# Patient Record
Sex: Female | Born: 1980 | Race: Black or African American | Hispanic: No | Marital: Married | Smoking: Former smoker
Health system: Southern US, Community
[De-identification: ages and names within clinical notes are randomized; demographics above are authoritative.]

## PROBLEM LIST (undated history)

## (undated) DIAGNOSIS — Z789 Other specified health status: Secondary | ICD-10-CM

## (undated) HISTORY — PX: DILATION AND CURETTAGE OF UTERUS: SHX78

---

## 2005-07-29 ENCOUNTER — Ambulatory Visit (HOSPITAL_COMMUNITY): Admission: RE | Admit: 2005-07-29 | Discharge: 2005-07-29 | Payer: Self-pay | Admitting: Gynecology

## 2005-09-15 ENCOUNTER — Ambulatory Visit (HOSPITAL_COMMUNITY): Admission: RE | Admit: 2005-09-15 | Discharge: 2005-09-15 | Payer: Self-pay | Admitting: Gynecology

## 2005-12-21 ENCOUNTER — Ambulatory Visit: Payer: Self-pay | Admitting: Family Medicine

## 2005-12-22 ENCOUNTER — Ambulatory Visit (HOSPITAL_COMMUNITY): Admission: RE | Admit: 2005-12-22 | Discharge: 2005-12-22 | Payer: Self-pay | Admitting: Family Medicine

## 2005-12-28 ENCOUNTER — Ambulatory Visit: Payer: Self-pay | Admitting: Family Medicine

## 2006-01-11 ENCOUNTER — Ambulatory Visit: Payer: Self-pay | Admitting: Obstetrics & Gynecology

## 2006-01-18 ENCOUNTER — Ambulatory Visit: Payer: Self-pay | Admitting: Obstetrics & Gynecology

## 2006-01-21 ENCOUNTER — Ambulatory Visit: Payer: Self-pay | Admitting: Family Medicine

## 2006-01-25 ENCOUNTER — Ambulatory Visit: Payer: Self-pay | Admitting: Obstetrics & Gynecology

## 2006-01-25 ENCOUNTER — Ambulatory Visit (HOSPITAL_COMMUNITY): Admission: RE | Admit: 2006-01-25 | Discharge: 2006-01-25 | Payer: Self-pay | Admitting: Gynecology

## 2006-01-28 ENCOUNTER — Ambulatory Visit: Payer: Self-pay | Admitting: Family Medicine

## 2006-02-01 ENCOUNTER — Ambulatory Visit (HOSPITAL_COMMUNITY): Admission: RE | Admit: 2006-02-01 | Discharge: 2006-02-01 | Payer: Self-pay | Admitting: Family Medicine

## 2006-02-01 ENCOUNTER — Ambulatory Visit: Payer: Self-pay | Admitting: Family Medicine

## 2006-02-04 ENCOUNTER — Ambulatory Visit: Payer: Self-pay | Admitting: Family Medicine

## 2006-02-06 ENCOUNTER — Inpatient Hospital Stay (HOSPITAL_COMMUNITY): Admission: AD | Admit: 2006-02-06 | Discharge: 2006-02-08 | Payer: Self-pay | Admitting: Obstetrics & Gynecology

## 2006-02-06 ENCOUNTER — Ambulatory Visit: Payer: Self-pay | Admitting: Obstetrics and Gynecology

## 2007-11-03 IMAGING — US US OB COMP LESS 14 WK
1 series · 14 of 28 positions shown · non-contrast
Comparison: none

CLINICAL DATA: Uncertain menstrual dates.  Irregular periods.  Evaluate dating.  
 OBSTETRICAL ULTRASOUND <14 WKS AND TRANSVAGINAL OB US:
TECHNIQUE: Both transabdominal and transvaginal ultrasound examinations were performed for complete evaluation of the gestation as well as the maternal uterus, adnexal regions, and pelvic cul-de-sac.

[Series 1: us ob comp less 14 wk · 0.24mm/px · 14 of 30 slices shown]
[im 2/30]
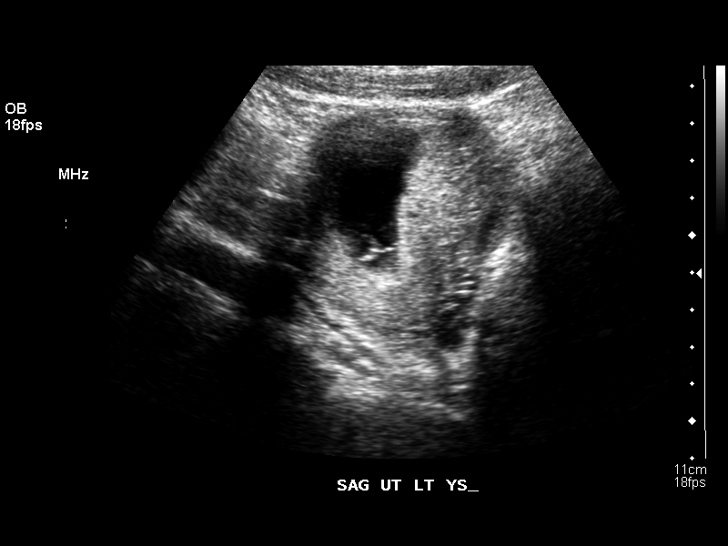
[im 4/30]
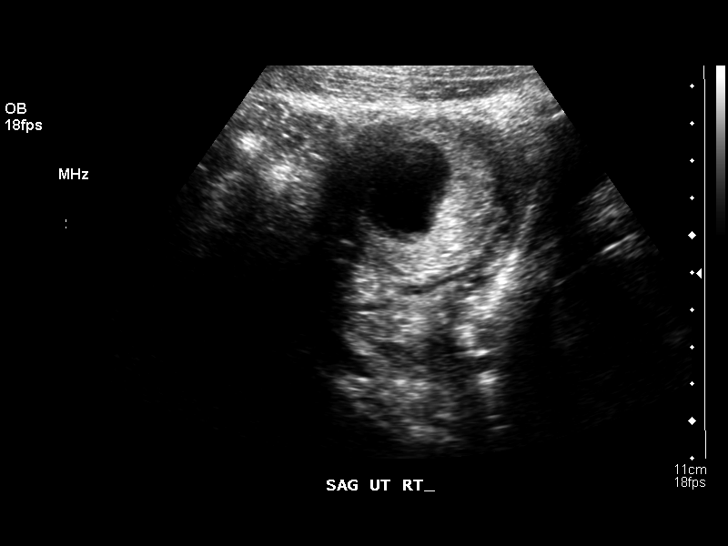
[im 6/30]
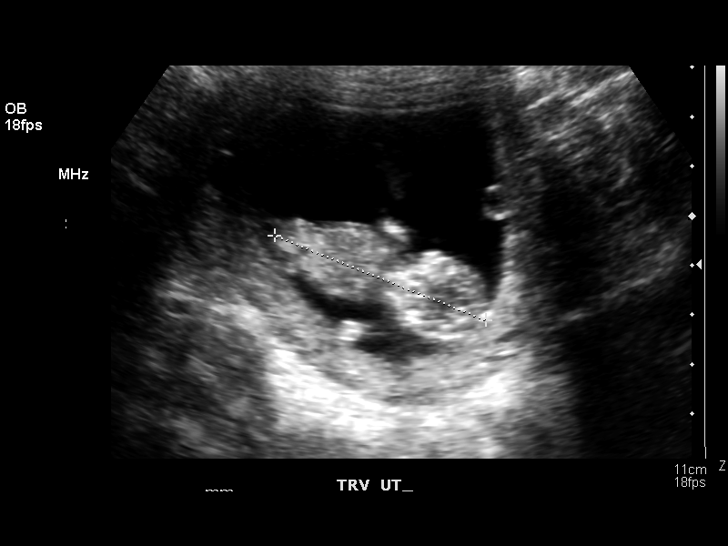
[im 8/30]
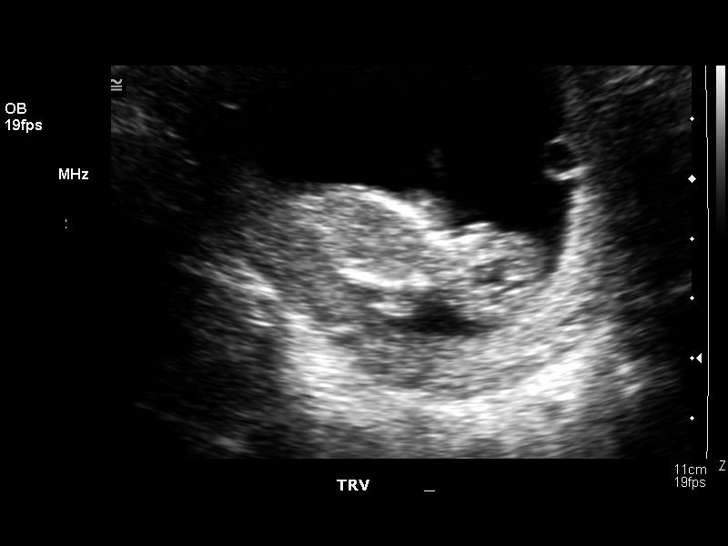
[im 10/30]
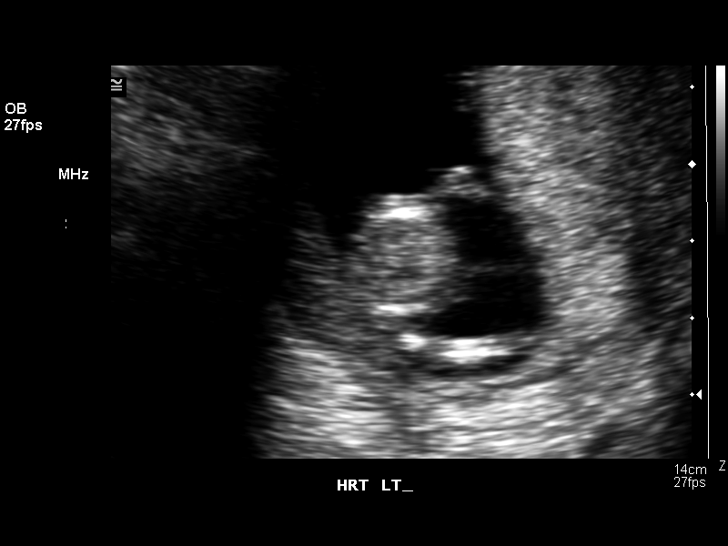
[im 12/30]
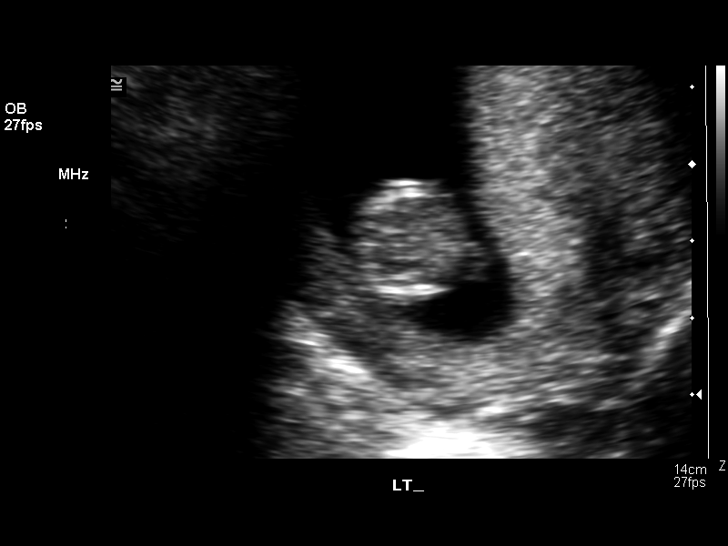
[im 14/30]
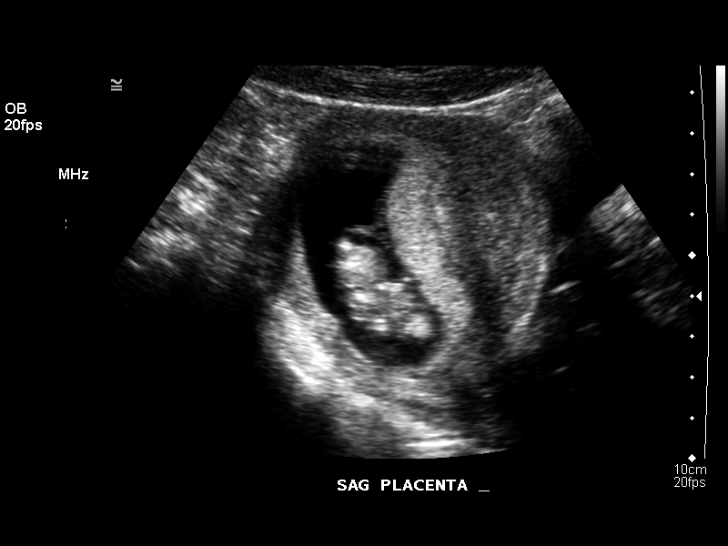
[im 17/30]
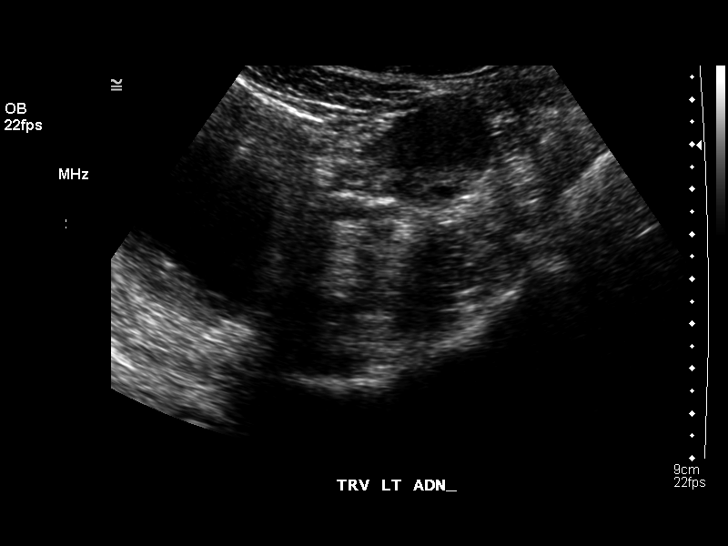
[im 19/30]
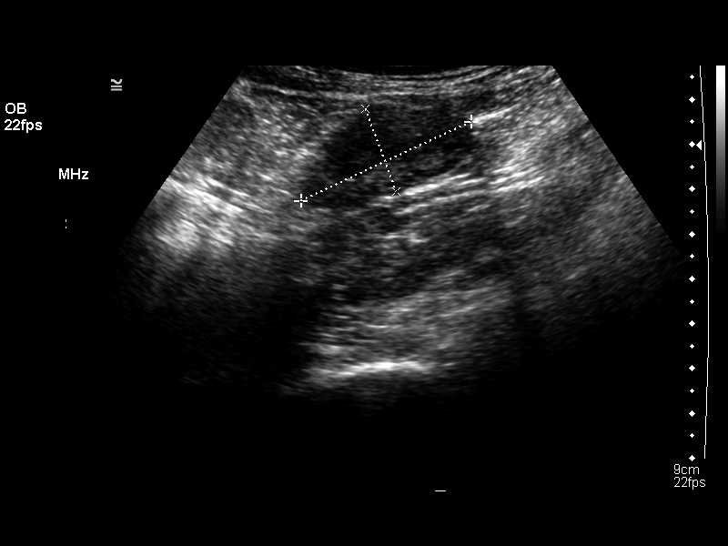
[im 21/30]
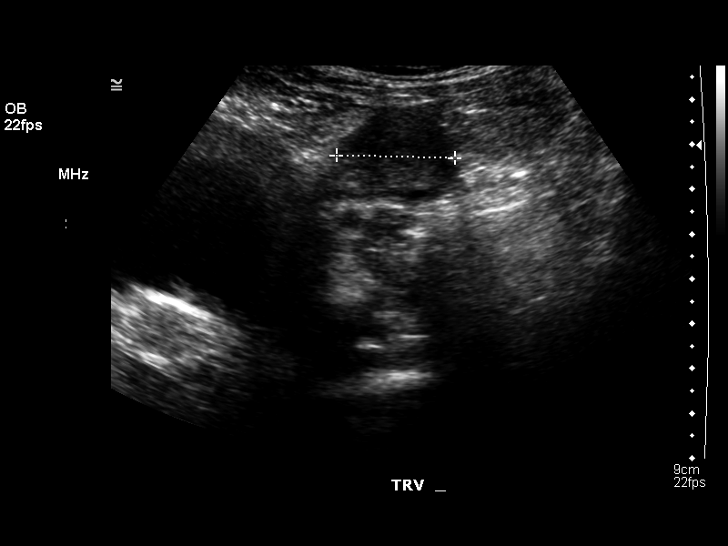
[im 23/30]
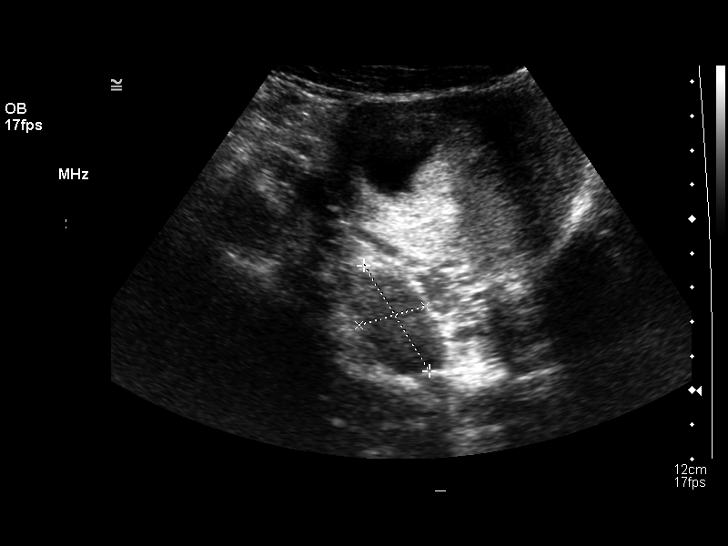
[im 25/30]
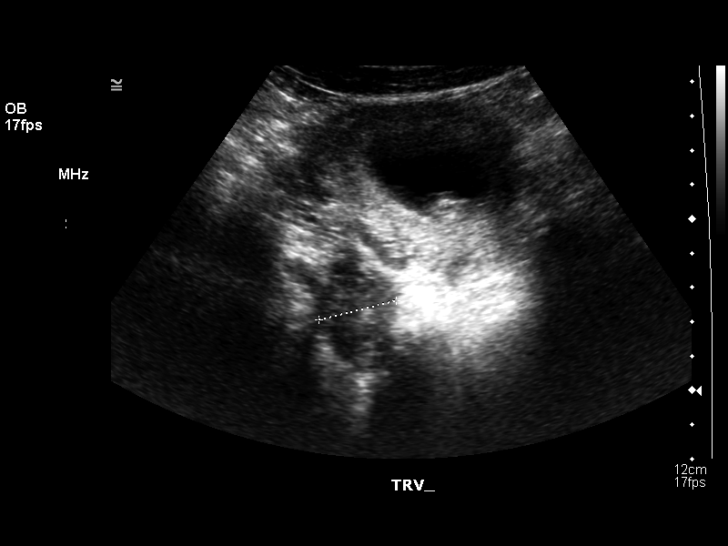
[im 27/30]
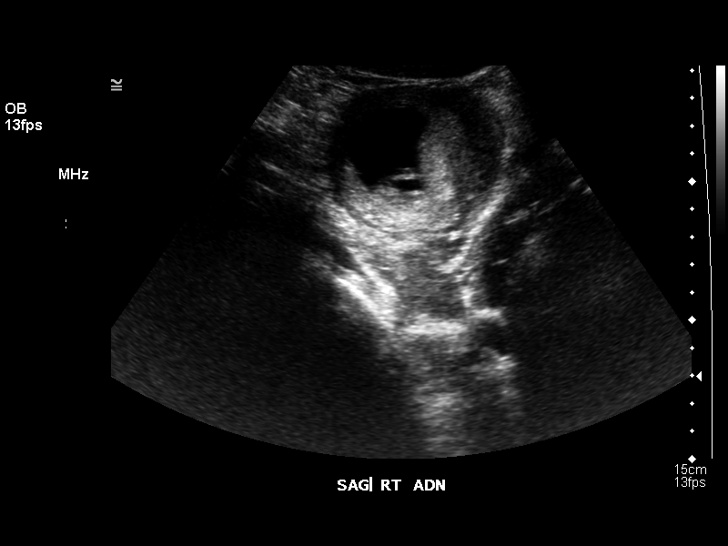
[im 30/30]
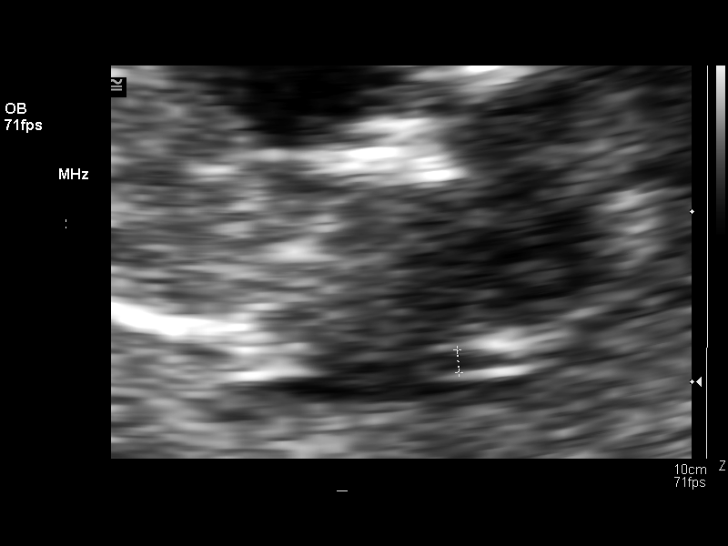

[14 of 28 positions shown; findings below may reference images not displayed]

FINDINGS: A single living intrauterine fetus is seen with measured heart rate of 180.  Fetal movement is noted.  Crown rump length measures 4.6 cm, corresponding with a gestational age of 11 weeks 3 days.  Amniotic fluid volume appears normal.  Early anterior placenta formation is noted.  There is no evidence of subchorionic hemorrhage.  
 No fibroids or other uterine abnormalities are seen.  Both ovaries are normal in appearance.  There is no evidence of adnexal mass or free fluid.
IMPRESSION: 1.  Single living intrauterine fetus with estimated gestational age of 11 weeks 3 days and sonographic EDC of 02/14/06.
 2.  No evidence of maternal uterine or adnexal abnormality.

## 2007-12-21 IMAGING — US US OB COMP +14 WK
1 series · 13 of 28 positions shown · non-contrast
Comparison: none

CLINICAL DATA: Anatomic survey.

[Series 1: us ob comp +14 wk · 0.29mm/px · 13 of 71 slices shown]
[im 3/71]
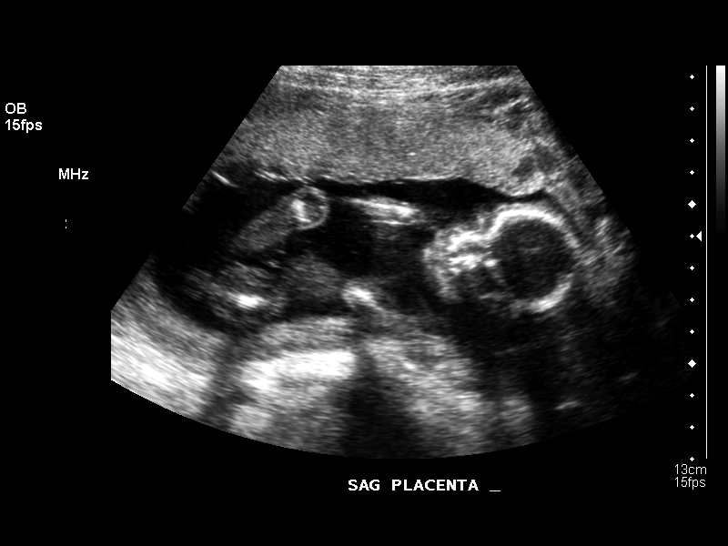
[im 8/71]
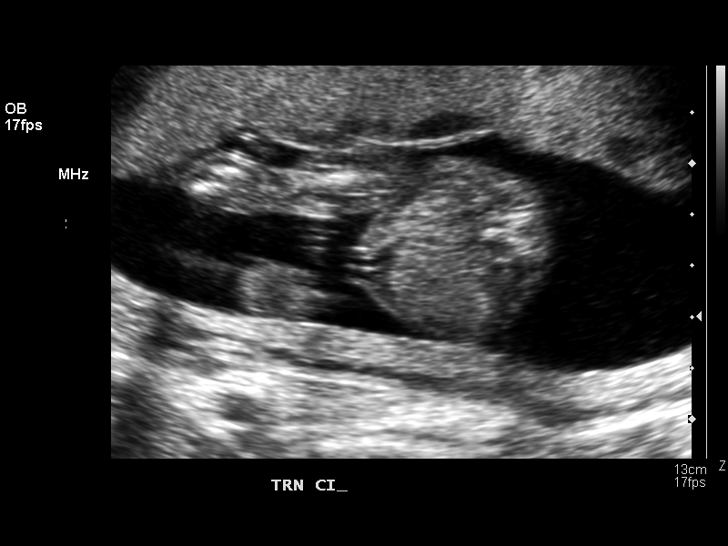
[im 13/71]
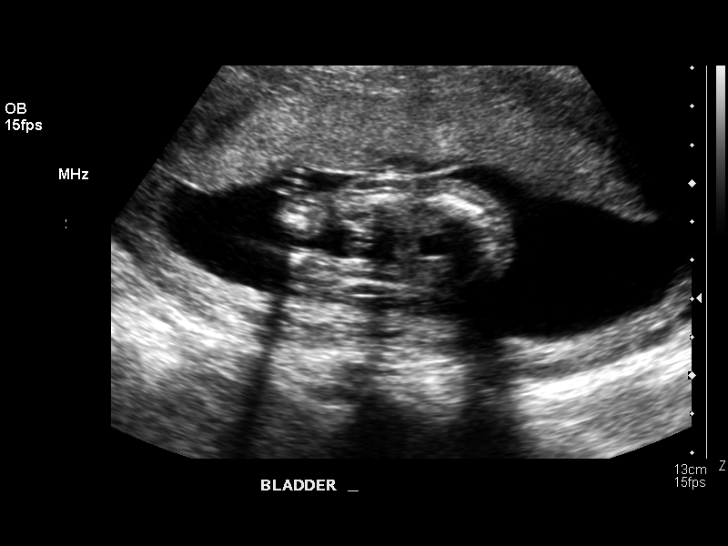
[im 19/71]
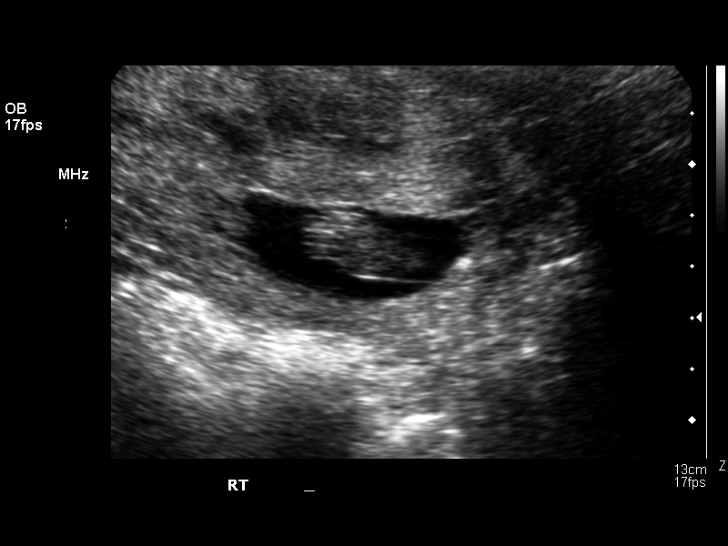
[im 24/71]
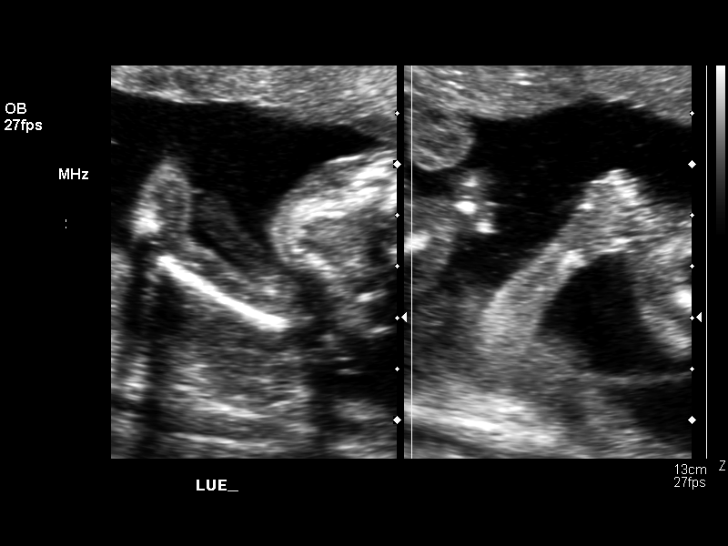
[im 29/71]
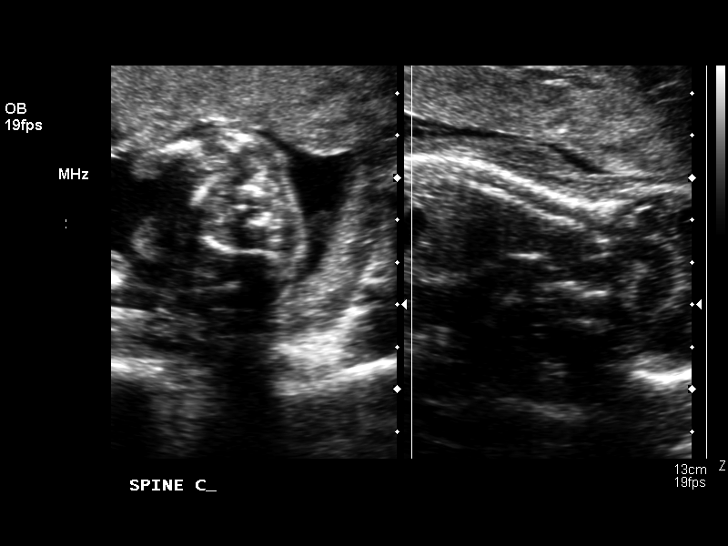
[im 37/71]
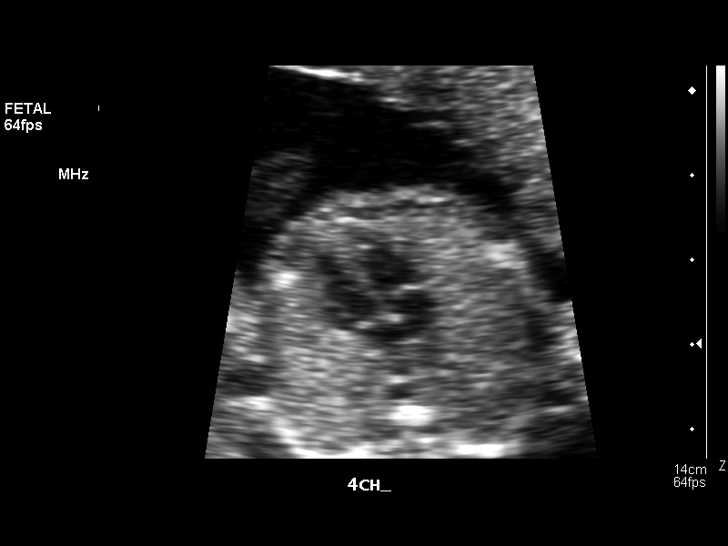
[im 42/71]
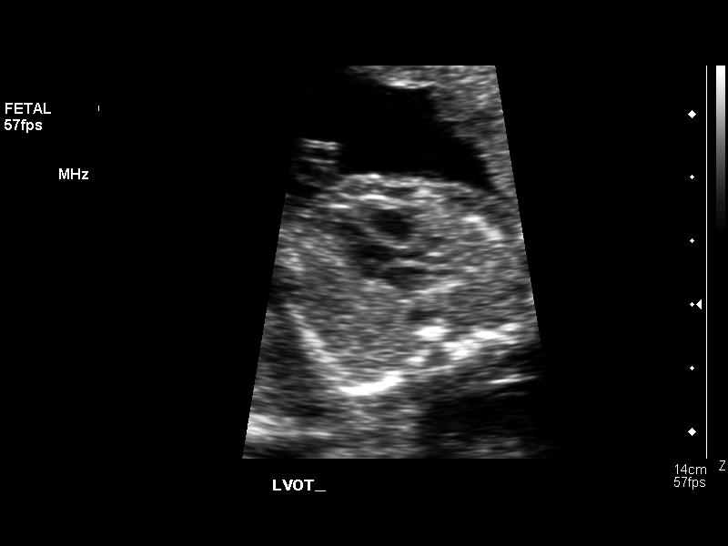
[im 47/71]
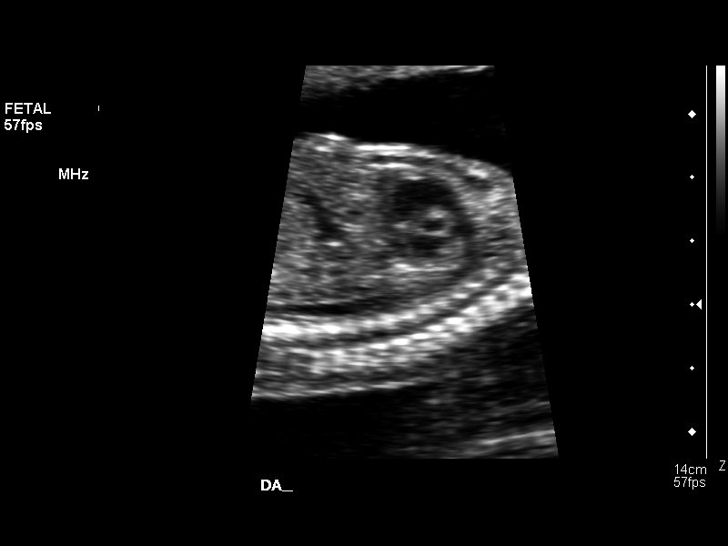
[im 52/71]
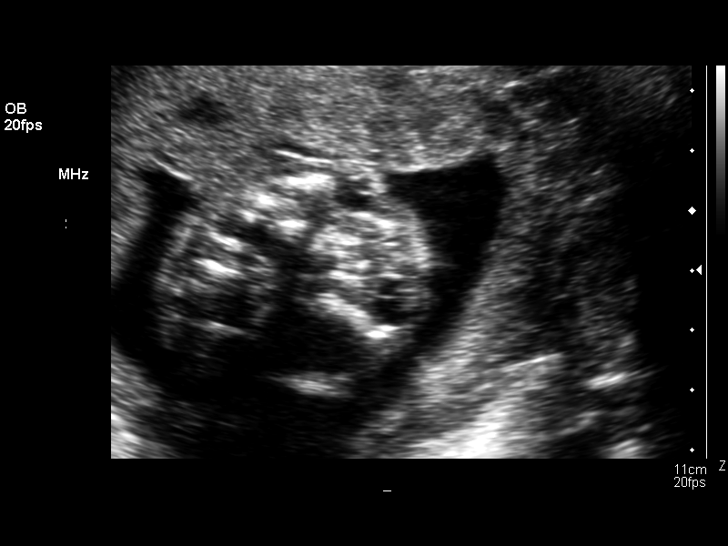
[im 58/71]
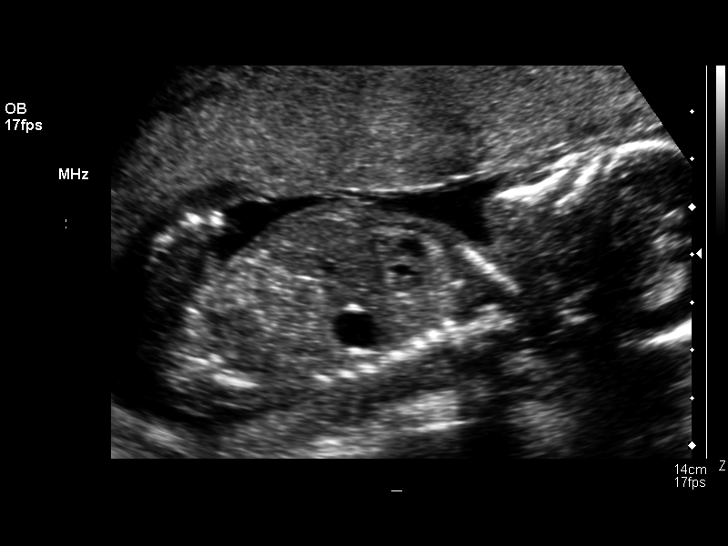
[im 63/71]
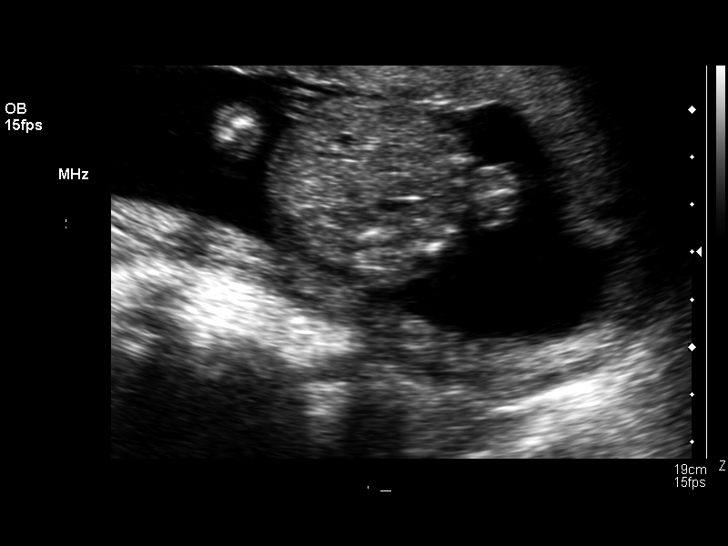
[im 68/71]
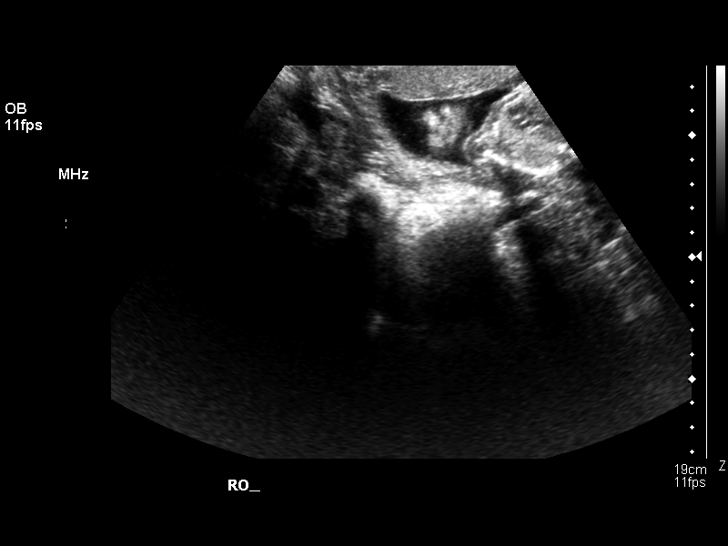

[13 of 28 positions shown; findings below may reference images not displayed]

OBSTETRICAL ULTRASOUND:
Number of Fetuses: 1
Heart Rate: 147
Movement:  Yes
Breathing:  No  
Presentation:  Cephalic
Placental Location:  Anterior
Grade: I
Previa:  No
Amniotic Fluid (Subjective):  Normal
Amniotic Fluid (Objective):   3.1 cm Vertical pocket 

FETAL BIOMETRY
BPD:  4.1 cm   18 w 2 d
HC:  15.1 cm  18 w 1 d
AC:  13.2 cm   18 w 5 d
FL:    2.9 cm   19 w 0 d

MEAN GA:  18 w 4 d   EDC:  02/12/06

FETAL ANATOMY
Lateral Ventricles:    Visualized 
Thalami/CSP:      Visualized 
Posterior Fossa:  Visualized 
Nuchal Region:    Visualized 
Spine:      Visualized 
4 Chamber Heart on Left:      Visualized 
Stomach on Left:      Visualized 
3 Vessel Cord:    Visualized 
Cord Insertion site:    Visualized 
Kidneys:  Visualized 
Bladder:  Visualized 
Extremities:      Visualized 

ADDITIONAL ANATOMY VISUALIZED:  LVOT, RVOT, upper lip, orbits, profile, diaphragm, heel, 5th digit, ductal arch, and aortic arch.

MATERNAL UTERINE AND ADNEXAL FINDINGS
Cervix: 3.9 cm transabdominally
IMPRESSION: Single live intrauterine gestation with average ultrasound age of 18 weeks 4 days (EDC 02/12/06), demonstrating concordant interval growth compared to gestational age by prior ultrasound of 18 weeks 2 days (EDC 02/14/06).  No fetal anomaly is seen with a good quality survey obtainable.

## 2008-06-30 ENCOUNTER — Ambulatory Visit (HOSPITAL_COMMUNITY): Admission: RE | Admit: 2008-06-30 | Discharge: 2008-06-30 | Payer: Self-pay | Admitting: Obstetrics and Gynecology

## 2008-06-30 ENCOUNTER — Encounter (INDEPENDENT_AMBULATORY_CARE_PROVIDER_SITE_OTHER): Payer: Self-pay | Admitting: Obstetrics and Gynecology

## 2008-10-18 ENCOUNTER — Inpatient Hospital Stay (HOSPITAL_COMMUNITY): Admission: AD | Admit: 2008-10-18 | Discharge: 2008-10-18 | Payer: Self-pay | Admitting: Obstetrics and Gynecology

## 2008-10-21 ENCOUNTER — Ambulatory Visit (HOSPITAL_COMMUNITY): Admission: AD | Admit: 2008-10-21 | Discharge: 2008-10-21 | Payer: Self-pay | Admitting: Obstetrics and Gynecology

## 2008-10-24 ENCOUNTER — Inpatient Hospital Stay (HOSPITAL_COMMUNITY): Admission: AD | Admit: 2008-10-24 | Discharge: 2008-10-24 | Payer: Self-pay | Admitting: Obstetrics and Gynecology

## 2008-10-31 ENCOUNTER — Ambulatory Visit (HOSPITAL_COMMUNITY): Admission: RE | Admit: 2008-10-31 | Discharge: 2008-10-31 | Payer: Self-pay | Admitting: Obstetrics and Gynecology

## 2008-11-07 ENCOUNTER — Ambulatory Visit (HOSPITAL_COMMUNITY): Admission: RE | Admit: 2008-11-07 | Discharge: 2008-11-07 | Payer: Self-pay | Admitting: Obstetrics and Gynecology

## 2008-11-14 ENCOUNTER — Inpatient Hospital Stay (HOSPITAL_COMMUNITY): Admission: AD | Admit: 2008-11-14 | Discharge: 2008-11-14 | Payer: Self-pay | Admitting: Obstetrics and Gynecology

## 2008-11-21 ENCOUNTER — Inpatient Hospital Stay (HOSPITAL_COMMUNITY): Admission: AD | Admit: 2008-11-21 | Discharge: 2008-11-21 | Payer: Self-pay | Admitting: Obstetrics and Gynecology

## 2008-11-28 ENCOUNTER — Inpatient Hospital Stay (HOSPITAL_COMMUNITY): Admission: AD | Admit: 2008-11-28 | Discharge: 2008-11-28 | Payer: Self-pay | Admitting: Obstetrics and Gynecology

## 2008-12-07 ENCOUNTER — Inpatient Hospital Stay (HOSPITAL_COMMUNITY): Admission: AD | Admit: 2008-12-07 | Discharge: 2008-12-07 | Payer: Self-pay | Admitting: Obstetrics and Gynecology

## 2008-12-14 ENCOUNTER — Inpatient Hospital Stay (HOSPITAL_COMMUNITY): Admission: AD | Admit: 2008-12-14 | Discharge: 2008-12-14 | Payer: Self-pay | Admitting: Obstetrics and Gynecology

## 2008-12-21 ENCOUNTER — Inpatient Hospital Stay (HOSPITAL_COMMUNITY): Admission: AD | Admit: 2008-12-21 | Discharge: 2008-12-21 | Payer: Self-pay | Admitting: Obstetrics and Gynecology

## 2009-10-30 ENCOUNTER — Emergency Department (HOSPITAL_COMMUNITY)
Admission: EM | Admit: 2009-10-30 | Discharge: 2009-10-30 | Payer: Self-pay | Source: Home / Self Care | Admitting: Emergency Medicine

## 2009-11-29 ENCOUNTER — Inpatient Hospital Stay (HOSPITAL_COMMUNITY): Admission: AD | Admit: 2009-11-29 | Discharge: 2009-11-29 | Payer: Self-pay | Admitting: Obstetrics and Gynecology

## 2009-11-29 ENCOUNTER — Ambulatory Visit: Payer: Self-pay | Admitting: Obstetrics and Gynecology

## 2010-02-13 ENCOUNTER — Encounter
Admission: RE | Admit: 2010-02-13 | Discharge: 2010-02-13 | Payer: Self-pay | Source: Home / Self Care | Attending: Obstetrics and Gynecology | Admitting: Obstetrics and Gynecology

## 2010-02-17 ENCOUNTER — Encounter
Admission: RE | Admit: 2010-02-17 | Discharge: 2010-03-18 | Payer: Self-pay | Source: Home / Self Care | Attending: Obstetrics and Gynecology | Admitting: Obstetrics and Gynecology

## 2010-05-01 LAB — URINALYSIS, ROUTINE W REFLEX MICROSCOPIC
Bilirubin Urine: NEGATIVE
Ketones, ur: 15 mg/dL — AB
Nitrite: NEGATIVE
Protein, ur: NEGATIVE mg/dL
Urobilinogen, UA: 0.2 mg/dL (ref 0.0–1.0)
pH: 7 (ref 5.0–8.0)

## 2010-05-01 LAB — WET PREP, GENITAL: Clue Cells Wet Prep HPF POC: NONE SEEN

## 2010-05-22 LAB — HCG, QUANTITATIVE, PREGNANCY
hCG, Beta Chain, Quant, S: 18 m[IU]/mL — ABNORMAL HIGH (ref <5)
hCG, Beta Chain, Quant, S: 4 m[IU]/mL (ref <5)
hCG, Beta Chain, Quant, S: 78 m[IU]/mL — ABNORMAL HIGH (ref <5)

## 2010-05-23 LAB — ABO/RH: ABO/RH(D): A POS

## 2010-05-23 LAB — DIFFERENTIAL
Eosinophils Absolute: 0.1 10*3/uL (ref 0.0–0.7)
Lymphs Abs: 2 10*3/uL (ref 0.7–4.0)
Monocytes Relative: 5 % (ref 3–12)
Neutro Abs: 6.3 10*3/uL (ref 1.7–7.7)
Neutrophils Relative %: 72 % (ref 43–77)

## 2010-05-23 LAB — CBC
HCT: 34.8 % — ABNORMAL LOW (ref 36.0–46.0)
MCHC: 33.7 g/dL (ref 30.0–36.0)
MCV: 97.5 fL (ref 78.0–100.0)
Platelets: 375 10*3/uL (ref 150–400)
WBC: 8.9 10*3/uL (ref 4.0–10.5)

## 2010-05-23 LAB — HCG, QUANTITATIVE, PREGNANCY
hCG, Beta Chain, Quant, S: 325 m[IU]/mL — ABNORMAL HIGH (ref <5)
hCG, Beta Chain, Quant, S: 4718 m[IU]/mL — ABNORMAL HIGH (ref <5)
hCG, Beta Chain, Quant, S: 634 m[IU]/mL — ABNORMAL HIGH (ref <5)

## 2010-05-26 ENCOUNTER — Inpatient Hospital Stay (HOSPITAL_COMMUNITY)
Admission: AD | Admit: 2010-05-26 | Discharge: 2010-05-29 | DRG: 775 | Disposition: A | Discharge status: Home / Self Care | Payer: 59 | Source: Ambulatory Visit | Attending: Obstetrics & Gynecology | Admitting: Obstetrics & Gynecology

## 2010-05-26 DIAGNOSIS — Z22330 Carrier of Group B streptococcus: Secondary | ICD-10-CM

## 2010-05-26 DIAGNOSIS — O99892 Other specified diseases and conditions complicating childbirth: Principal | ICD-10-CM | POA: Diagnosis present

## 2010-05-26 LAB — CBC
Platelets: 239 10*3/uL (ref 150–400)
RDW: 13.6 % (ref 11.5–15.5)
WBC: 9.2 10*3/uL (ref 4.0–10.5)

## 2010-05-27 LAB — CBC
HCT: 40.3 % (ref 36.0–46.0)
MCV: 95.9 fL (ref 78.0–100.0)
Platelets: 244 10*3/uL (ref 150–400)
WBC: 7.3 10*3/uL (ref 4.0–10.5)

## 2010-05-28 LAB — CBC
HCT: 32.3 % — ABNORMAL LOW (ref 36.0–46.0)
Hemoglobin: 10.4 g/dL — ABNORMAL LOW (ref 12.0–15.0)
MCH: 30 pg (ref 26.0–34.0)
MCHC: 32.2 g/dL (ref 30.0–36.0)

## 2010-07-01 NOTE — Op Note (Signed)
NAME:  Brooke Thornton, Brooke Thornton NO.:  [id number]   MEDICAL RECORD NO.:  [id number]          PATIENT TYPE:  AMB   LOCATION:  SDC                           FACILITY:  [facility]   PHYSICIAN:  Hal Morales, M.D.DATE OF BIRTH:  23-Jun-1980   DATE OF PROCEDURE:  06/30/2008  DATE OF DISCHARGE:  06/30/2008                               OPERATIVE REPORT   PREOPERATIVE DIAGNOSIS:  Metrorrhagia, endometrial polyp.   POSTOPERATIVE DIAGNOSIS:  Metrorrhagia.   PROCEDURE:  Hysteroscopy, dilation and curettage.   SURGEON:  Vanessa P. Haygood, MD   ANESTHESIA:  General.   ESTIMATED BLOOD LOSS:  Less than 10 mL.   COMPLICATIONS:  None.   FINDINGS:  The endometrial cavity was within normal limits without  visible lesions.   PROCEDURE IN DETAIL:  The patient was taken to the operating room after  appropriate identification and placed on the operating table.  After the  attainment of adequate general anesthesia, she was placed in the  lithotomy position.  The perineum and vagina were prepped with multiple  layers of Betadine and a red Robinson catheter was used to empty the  bladder.  The perineum was draped as a sterile field.  A Graves speculum  was placed in the vagina and a single-tooth tenaculum was placed on the  anterior cervix after a paracervical block with a total of 10 mL of 2%  Xylocaine had been obtained.  The cervix was dilated to accommodate the  operative hysteroscope and this was used to evaluate the endometrial  cavity with the above-noted findings.  The hysteroscope was removed and  all quadrants of the endometrial cavity curetted with a small amount of  curettings obtained.  All instruments were then removed from the vagina  and the patient awakened from general anesthesia, taken to the recovery  room in satisfactory condition having tolerated the procedure well with  sponge and instrument counts were correct.   SPECIMENS TO PATHOLOGY:  Endometrial curettings.   DISCHARGE INSTRUCTIONS:  Printed instructions for D&C from the [facility].   DISCHARGE MEDICATIONS:  Ibuprofen 600 mg p.o. q.6 h. p.r.n. pain.   FOLLOWUP:  Follow up in 2 weeks with Dr. Pennie Rushing.      Hal Morales, M.D.  Electronically Signed     VPH/MEDQ  D:  06/30/2008  T:  07/01/2008  Job:  [id number]

## 2010-07-01 NOTE — H&P (Signed)
NAME:  Brooke Thornton, Brooke Thornton NO.:  [id number]   MEDICAL RECORD NO.:  [id number]         PATIENT TYPE:  AMB   LOCATION:  DAY                           [facility]ILITY:  [facility]   PHYSICIAN:  Hal Morales, M.D.DATE OF BIRTH:  09-Nov-1980   DATE OF ADMISSION:  06/30/2008  DATE OF DISCHARGE:                              HISTORY & PHYSICAL   __________   HISTORY OF PRESENT ILLNESS:  The patient is a 30 year old black married  female para 1-0-0-1 who presents with markedly irregular menses over the  last 8 to 10 months and a desire for conception.   The patient had her last menstrual period on Jun 16, 2008.  For the last  8 to 10 months she has been having menses approximately every 14 days  that last from 5-7 days.  There has likewise been some intermenstrual  bleeding.  The menses are not terribly heavy, using two pads per day but  are associated with occasional cramps rated at three on a scale of 1-10.  These menses are likewise associated with headaches.  Prior to that time  the patient had some episodes of oligomenorrhea, but primarily had  menses once per month.  She underwent the following workup.  She had a  thyroid stimulating hormone which was within normal limits.  Prolactin  hormone which was within normal limits. A normal fasting glucose and a  normal fasting insulin.  Her hemoglobin was 13.5.  She underwent an  ultrasound and sonohysterogram showing a normal size uterus with normal  ovaries bilaterally.  At the time of endometrial biopsy, she did have a  mass in the endometrium consistent with a polyp measuring 0.4 x 0.99 x  0.97 cm.   PAST MEDICAL HISTORY:  1. Positive for hemoglobin AS.   PAST SURGICAL HISTORY:  1. Right inguinal hernia repair.  2. Third molar extraction.   CURRENT MEDICATIONS:  None.   ALLERGIES:  NO KNOWN DRUG ALLERGIES.   FAMILY HISTORY:  Positive for a paternal aunt with ovarian cancer and  first-degree relative with breast cancer at  age 74.   SOCIAL HISTORY:  The patient is married and works as a Chief Operating Officer at [employer]. She smokes about 2 cigarettes a day for the last  10 years and drinks socially.   OBSTETRICAL HISTORY:  She had one full-term delivery complicated by  gestational diabetes.   REVIEW OF SYSTEMS:  Negative except as mentioned above.   IMPRESSION:  1. Metrorrhagia.  2. Remote history of oligomenorrhea  3. Desires conception  4. History of gestational diabetes.  5. Endometrial polyp less than 1 cm.   DISPOSITION:  A discussion is held with the patient concerning options  for management including medication and surgery.  The patient has used  birth control pills off and on with good regulation of her cycle,  however she is trying to conceive and therefore does not want oral  contraceptives at this time.  She used Depo-Provera from the time of her  delivery 2 years ago until February of 2010 at which time she  discontinued her Depo-Provera.  She had menorrhea during the time of her  Depo-Provera use.  She does want no further hormonal manipulation and  wishes to proceed with hysteroscopy and removal of endometrial polyp.  The risks of anesthesia, bleeding, infection and damage to adjacent  organs have been explained to her.  It is likewise been explained to her  that a particular risk of this surgery is uterine perforation and she  seems to understand and wishes to proceed with hysteroscopy and  polypectomy.      Hal Morales, M.D.  Electronically Signed     VPH/MEDQ  D:  06/24/2008  T:  06/24/2008  Job:  [id number]

## 2012-06-24 ENCOUNTER — Encounter (HOSPITAL_COMMUNITY): Payer: Self-pay | Admitting: *Deleted

## 2012-06-24 ENCOUNTER — Inpatient Hospital Stay (HOSPITAL_COMMUNITY)
Admission: AD | Admit: 2012-06-24 | Discharge: 2012-06-25 | Disposition: A | Discharge status: Home / Self Care | Payer: 59 | Source: Ambulatory Visit | Attending: Obstetrics & Gynecology | Admitting: Obstetrics & Gynecology

## 2012-06-24 ENCOUNTER — Inpatient Hospital Stay (HOSPITAL_COMMUNITY): Payer: 59

## 2012-06-24 DIAGNOSIS — N83209 Unspecified ovarian cyst, unspecified side: Secondary | ICD-10-CM

## 2012-06-24 DIAGNOSIS — N83299 Other ovarian cyst, unspecified side: Secondary | ICD-10-CM

## 2012-06-24 DIAGNOSIS — N949 Unspecified condition associated with female genital organs and menstrual cycle: Secondary | ICD-10-CM | POA: Insufficient documentation

## 2012-06-24 DIAGNOSIS — K6289 Other specified diseases of anus and rectum: Secondary | ICD-10-CM | POA: Insufficient documentation

## 2012-06-24 HISTORY — DX: Other specified health status: Z78.9

## 2012-06-24 LAB — WET PREP, GENITAL
Clue Cells Wet Prep HPF POC: NONE SEEN
Trich, Wet Prep: NONE SEEN

## 2012-06-24 LAB — URINALYSIS, ROUTINE W REFLEX MICROSCOPIC
Glucose, UA: NEGATIVE mg/dL
Ketones, ur: 15 mg/dL — AB
Protein, ur: NEGATIVE mg/dL

## 2012-06-24 LAB — URINE MICROSCOPIC-ADD ON

## 2012-06-24 MED ORDER — KETOROLAC TROMETHAMINE 10 MG PO TABS: 10.0000 mg | ORAL_TABLET | Freq: Four times a day (QID) | ORAL | Status: DC | PRN

## 2012-06-24 NOTE — MAU Provider Note (Signed)
Chief Complaint: Rectal Pain  First Provider Initiated Contact with Patient 06/24/12 2144     SUBJECTIVE HPI: Brooke Thornton is a 32 y.o. [id number] female who presents with rectal pain since yesterday, 10/10 initially. States it felt the same as when she was giving birth to her children. Now 5/10. Has not taken any medication for the pain. There are no aggravating or alleviating factors. Patient denies diarrhea or constipation. Last bowel movement today.  Past Medical History  Diagnosis Date  . Medical history non-contributory    OB History   Grav Para Term Preterm Abortions TAB SAB Ect Mult Living   4 2 2  2 1 1   2      # Outc Date GA Lbr Len/2nd Wgt Sex Del Anes PTL Lv   1 TRM            2 TRM            3 SAB            4 TAB              Past Surgical History  Procedure Laterality Date  . Dilation and curettage of uterus     History   Social History  . Marital Status: Married    Spouse Name: N/A    Number of Children: N/A  . Years of Education: N/A   Occupational History  . Not on file.   Social History Main Topics  . Smoking status: Current Every Day Smoker -- 3.00 packs/day    Types: Cigars  . Smokeless tobacco: Not on file  . Alcohol Use: No  . Drug Use: No  . Sexually Active: Not on file   Other Topics Concern  . Not on file   Social History Narrative  . No narrative on file   No current facility-administered medications on file prior to encounter.   No current outpatient prescriptions on file prior to encounter.   Allergies  Allergen Reactions  . Peach (Prunus Persica) Itching and Swelling    Itching is of the throat, swelling is of the eyes.  . Tomato Hives    ROS: Pertinent items in HPI  OBJECTIVE Blood pressure 123/86, pulse 59, temperature 98.9 F (37.2 C), temperature source Oral, resp. rate 16, height 5\' 3"  (1.6 m), weight 65.772 kg (145 lb), last menstrual period 06/10/2012. GENERAL: Well-developed, well-nourished female in mild  distress.  HEENT: Normocephalic HEART: normal rate RESP: normal effort ABDOMEN: Soft, non-tender EXTREMITIES: Nontender, no edema NEURO: Alert and oriented SPECULUM EXAM: NEFG, physiologic discharge, no blood noted, cervix clean. No rectocele noted at rest or with Valsalva maneuver. BIMANUAL: cervix closed; uterus normal size, mild bilateral adnexal tenderness. No masses. No cervical motion tenderness or posterior vaginal wall tenderness. RECTAL EXAM: No external hemorrhoids visualized. No stool or internal hemorrhoids palpated internally. LAB RESULTS Results for orders placed during the hospital encounter of 06/24/12 (from the past 24 hour(s))  URINALYSIS, ROUTINE W REFLEX MICROSCOPIC     Status: Abnormal   Collection Time    06/24/12  9:25 PM      Result Value Range   Color, Urine YELLOW  YELLOW   APPearance CLEAR  CLEAR   Specific Gravity, Urine >1.030 (*) 1.005 - 1.030   pH 6.0  5.0 - 8.0   Glucose, UA NEGATIVE  NEGATIVE mg/dL   Hgb urine dipstick SMALL (*) NEGATIVE   Bilirubin Urine NEGATIVE  NEGATIVE   Ketones, ur 15 (*) NEGATIVE mg/dL   Protein, ur  NEGATIVE  NEGATIVE mg/dL   Urobilinogen, UA 0.2  0.0 - 1.0 mg/dL   Nitrite NEGATIVE  NEGATIVE   Leukocytes, UA NEGATIVE  NEGATIVE  URINE MICROSCOPIC-ADD ON     Status: None   Collection Time    06/24/12  9:25 PM      Result Value Range   Squamous Epithelial / LPF RARE  RARE   WBC, UA 0-2  <3 WBC/hpf   RBC / HPF 0-2  <3 RBC/hpf   Urine-Other MUCOUS PRESENT    WET PREP, GENITAL     Status: Abnormal   Collection Time    06/24/12 10:00 PM      Result Value Range   Yeast Wet Prep HPF POC NONE SEEN  NONE SEEN   Trich, Wet Prep NONE SEEN  NONE SEEN   Clue Cells Wet Prep HPF POC NONE SEEN  NONE SEEN   WBC, Wet Prep HPF POC FEW (*) NONE SEEN    IMAGING US Transvaginal Non-ob  06/24/2012   *RADIOLOGY REPORT*  Clinical Data:  Pelvic and rectal pressure  TRANSABDOMINAL AND TRANSVAGINAL ULTRASOUND OF PELVIS Technique:  Both  transabdominal and transvaginal ultrasound examinations of the pelvis were performed. Transabdominal technique was performed for global imaging of the pelvis including uterus, ovaries, adnexal regions, and pelvic cul-de-sac.  It was necessary to proceed with endovaginal exam following the transabdominal exam to visualize the endometrium.  Comparison:  None  Findings:  Uterus: 8.4 x 4.3 x 5.0 cm.  Normal morphology without mass.  Endometrium: 5 mm thick, normal.  No endometrial fluid.  Right ovary:  3.5 x 2.7 x 2.0 cm.  Normal morphology without mass.  Left ovary: 5.3 x 2.5 x 3.7 cm. Mildly complicated cyst with few thin septations in left ovary 4.1 x 1.7 x 2.6 cm.  No discrete mural nodularity.  Other findings: No free pelvic fluid or adnexal masses.  IMPRESSION: Mildly complicated cyst left ovary 4.1 x 1.7 x 2.6 cm. Otherwise normal exam.   Original Report Authenticated By: Ulyses Southward, M.D.   US Pelvis Complete  06/24/2012   *RADIOLOGY REPORT*  Clinical Data:  Pelvic and rectal pressure  TRANSABDOMINAL AND TRANSVAGINAL ULTRASOUND OF PELVIS Technique:  Both transabdominal and transvaginal ultrasound examinations of the pelvis were performed. Transabdominal technique was performed for global imaging of the pelvis including uterus, ovaries, adnexal regions, and pelvic cul-de-sac.  It was necessary to proceed with endovaginal exam following the transabdominal exam to visualize the endometrium.  Comparison:  None  Findings:  Uterus: 8.4 x 4.3 x 5.0 cm.  Normal morphology without mass.  Endometrium: 5 mm thick, normal.  No endometrial fluid.  Right ovary:  3.5 x 2.7 x 2.0 cm.  Normal morphology without mass.  Left ovary: 5.3 x 2.5 x 3.7 cm. Mildly complicated cyst with few thin septations in left ovary 4.1 x 1.7 x 2.6 cm.  No discrete mural nodularity.  Other findings: No free pelvic fluid or adnexal masses.  IMPRESSION: Mildly complicated cyst left ovary 4.1 x 1.7 x 2.6 cm. Otherwise normal exam.   Original Report  Authenticated By: Ulyses Southward, M.D.   MAU COURSE Declined pain medications while in maternity admissions.  ASSESSMENT 1. Ovarian cyst, complex     PLAN Discharge home in stable condition.      Follow-up Information   Follow up with CRAWFORD, ROBERT, MD. (in 6-8 weeks for repeat ultrasound)    Contact information:   [address] [place] [phone]  Follow up with Emergency department . (As needed if symptoms worsen)        Medication List    TAKE these medications       ketorolac 10 MG tablet  Commonly known as:  TORADOL  Take 1 tablet (10 mg total) by mouth every 6 (six) hours as needed for pain.     MINASTRIN 24 FE 1-20 MG-MCG(24) Chew  Generic drug:  Norethin Ace-Eth Estrad-FE  Chew 1 tablet by mouth daily.     OVER THE COUNTER MEDICATION  Take 1 capsule by mouth 2 (two) times daily. Takes one capsule before breakfast and one before lunch. Dietary supplement contains 50 mg of calcium, 200 mcg of chromium, 50 mg of potassium, and 1000 mg of Garcinia Cambogia extract.        [place], CNM 06/24/2012  11:44 PM

## 2012-07-04 NOTE — MAU Provider Note (Signed)
Pt will need to f/u in 6-8 weeks for rpt [place].  Pt plans to go to dr. Okey Dupre.  Attestation of Attending Supervision of Advanced Practitioner (CNM/NP): Evaluation and management procedures were performed by the Advanced Practitioner under my supervision and collaboration. I have reviewed the Advanced Practitioner's note and chart, and I agree with the management and plan.  LEGGETT,KELLY H. 12:17 PM

## 2013-12-18 ENCOUNTER — Encounter (HOSPITAL_COMMUNITY): Payer: Self-pay | Admitting: *Deleted

## 2016-02-17 NOTE — L&D Delivery Note (Signed)
Operative Delivery Note At 9:12 AM a viable female was delivered via .  Presentation: vertex; Position: Right,, Occiput,, Anterior; Station: +2.  Vacuum delivery done urgently for prolonged FHR decels  APGAR: 8, 9; weight  pending.   Placenta status: spontaneous, intact.   Cord:  with the following complications: none.   Anesthesia:  Epidural Instruments: Kiwi Episiotomy:  No Lacerations: superficial periurethral  Suture Repair: none Est. Blood Loss (mL):  200  Mom to postpartum.  Baby to Couplet care / Skin to Skin.  She wants BTL, discussed procedure, risks, permanency and she wants to proceed.  Will schedule for after my other pending delivery.  Leighton Roachodd D Kyasia Steuck 12/26/2016, 9:26 AM

## 2016-08-14 LAB — OB RESULTS CONSOLE RPR: RPR: NONREACTIVE

## 2016-08-14 LAB — OB RESULTS CONSOLE ABO/RH: RH Type: POSITIVE

## 2016-08-14 LAB — OB RESULTS CONSOLE HEPATITIS B SURFACE ANTIGEN: HEP B S AG: NEGATIVE

## 2016-08-14 LAB — OB RESULTS CONSOLE GC/CHLAMYDIA
CHLAMYDIA, DNA PROBE: NEGATIVE
GC PROBE AMP, GENITAL: NEGATIVE

## 2016-08-14 LAB — OB RESULTS CONSOLE HIV ANTIBODY (ROUTINE TESTING): HIV: NONREACTIVE

## 2016-08-14 LAB — OB RESULTS CONSOLE RUBELLA ANTIBODY, IGM: RUBELLA: IMMUNE

## 2016-10-28 ENCOUNTER — Encounter: Payer: Medicaid Other | Attending: Obstetrics and Gynecology | Admitting: Registered"

## 2016-10-28 ENCOUNTER — Encounter: Payer: Self-pay | Admitting: Registered"

## 2016-10-28 DIAGNOSIS — O24410 Gestational diabetes mellitus in pregnancy, diet controlled: Secondary | ICD-10-CM | POA: Diagnosis not present

## 2016-10-28 DIAGNOSIS — Z3A00 Weeks of gestation of pregnancy not specified: Secondary | ICD-10-CM | POA: Insufficient documentation

## 2016-10-28 DIAGNOSIS — Z713 Dietary counseling and surveillance: Secondary | ICD-10-CM | POA: Insufficient documentation

## 2016-10-28 DIAGNOSIS — R7309 Other abnormal glucose: Secondary | ICD-10-CM

## 2016-10-28 NOTE — Progress Notes (Signed)
Patient was seen on 10/28/2016 for Gestational Diabetes self-management class at the [facility]. The following learning objectives were met by the patient during this course:   States the definition of Gestational Diabetes  States why dietary management is important in controlling blood glucose  Describes the effects each nutrient has on blood glucose levels  Demonstrates ability to create a balanced meal plan  Demonstrates carbohydrate counting   States when to check blood glucose levels  Demonstrates proper blood glucose monitoring techniques  States the effect of stress and exercise on blood glucose levels  States the importance of limiting caffeine and abstaining from alcohol and smoking  Blood glucose monitor given: Accu-Chek Guide Lot # [id number] Exp: 05-06-17 Blood glucose reading: 90  Patient instructed to monitor glucose levels: FBS: 60 - <95 1 hour: <140 2 hour: <120  Patient received handouts:  Nutrition Diabetes and Pregnancy  Carbohydrate Counting List  Patient will be seen for follow-up as needed.

## 2016-12-09 LAB — OB RESULTS CONSOLE GBS: STREP GROUP B AG: NEGATIVE

## 2016-12-26 ENCOUNTER — Inpatient Hospital Stay (HOSPITAL_COMMUNITY): Payer: Medicaid Other | Admitting: Anesthesiology

## 2016-12-26 ENCOUNTER — Inpatient Hospital Stay (HOSPITAL_COMMUNITY)
Admission: AD | Admit: 2016-12-26 | Discharge: 2016-12-28 | DRG: 798 | Disposition: A | Discharge status: Home / Self Care | Payer: Medicaid Other | Source: Ambulatory Visit | Attending: Obstetrics and Gynecology | Admitting: Obstetrics and Gynecology

## 2016-12-26 ENCOUNTER — Encounter (HOSPITAL_COMMUNITY): Payer: Self-pay | Admitting: Emergency Medicine

## 2016-12-26 DIAGNOSIS — O99214 Obesity complicating childbirth: Secondary | ICD-10-CM | POA: Diagnosis present

## 2016-12-26 DIAGNOSIS — Z87891 Personal history of nicotine dependence: Secondary | ICD-10-CM | POA: Diagnosis not present

## 2016-12-26 DIAGNOSIS — Z880 Allergy status to penicillin: Secondary | ICD-10-CM

## 2016-12-26 DIAGNOSIS — Z302 Encounter for sterilization: Secondary | ICD-10-CM | POA: Diagnosis not present

## 2016-12-26 DIAGNOSIS — O1493 Unspecified pre-eclampsia, third trimester: Secondary | ICD-10-CM | POA: Diagnosis present

## 2016-12-26 DIAGNOSIS — O24420 Gestational diabetes mellitus in childbirth, diet controlled: Secondary | ICD-10-CM | POA: Diagnosis present

## 2016-12-26 DIAGNOSIS — O1414 Severe pre-eclampsia complicating childbirth: Secondary | ICD-10-CM | POA: Diagnosis present

## 2016-12-26 DIAGNOSIS — E669 Obesity, unspecified: Secondary | ICD-10-CM | POA: Diagnosis present

## 2016-12-26 DIAGNOSIS — Z3A38 38 weeks gestation of pregnancy: Secondary | ICD-10-CM

## 2016-12-26 LAB — COMPREHENSIVE METABOLIC PANEL
ALT: 19 U/L (ref 14–54)
AST: 33 U/L (ref 15–41)
Albumin: 3.1 g/dL — ABNORMAL LOW (ref 3.5–5.0)
Alkaline Phosphatase: 245 U/L — ABNORMAL HIGH (ref 38–126)
Anion gap: 11 (ref 5–15)
BUN: 11 mg/dL (ref 6–20)
CHLORIDE: 104 mmol/L (ref 101–111)
CO2: 19 mmol/L — ABNORMAL LOW (ref 22–32)
Calcium: 8.8 mg/dL — ABNORMAL LOW (ref 8.9–10.3)
Creatinine, Ser: 0.55 mg/dL (ref 0.44–1.00)
Glucose, Bld: 95 mg/dL (ref 65–99)
POTASSIUM: 4 mmol/L (ref 3.5–5.1)
Sodium: 134 mmol/L — ABNORMAL LOW (ref 135–145)
Total Bilirubin: 0.5 mg/dL (ref 0.3–1.2)
Total Protein: 6.6 g/dL (ref 6.5–8.1)

## 2016-12-26 LAB — CBC
HCT: 37.8 % (ref 36.0–46.0)
Hemoglobin: 12.9 g/dL (ref 12.0–15.0)
MCH: 31.9 pg (ref 26.0–34.0)
MCHC: 34.1 g/dL (ref 30.0–36.0)
MCV: 93.6 fL (ref 78.0–100.0)
PLATELETS: 214 10*3/uL (ref 150–400)
RBC: 4.04 MIL/uL (ref 3.87–5.11)
RDW: 12.8 % (ref 11.5–15.5)
WBC: 8.8 10*3/uL (ref 4.0–10.5)

## 2016-12-26 LAB — URINALYSIS, ROUTINE W REFLEX MICROSCOPIC
BILIRUBIN URINE: NEGATIVE
GLUCOSE, UA: NEGATIVE mg/dL
HGB URINE DIPSTICK: NEGATIVE
Ketones, ur: 5 mg/dL — AB
LEUKOCYTES UA: NEGATIVE
NITRITE: NEGATIVE
PH: 5 (ref 5.0–8.0)
Protein, ur: 100 mg/dL — AB
Specific Gravity, Urine: 1.016 (ref 1.005–1.030)

## 2016-12-26 LAB — TYPE AND SCREEN
ABO/RH(D): A POS
ANTIBODY SCREEN: NEGATIVE

## 2016-12-26 LAB — PROTEIN / CREATININE RATIO, URINE
Creatinine, Urine: 126 mg/dL
Protein Creatinine Ratio: 0.83 mg/mg{Cre} — ABNORMAL HIGH (ref 0.00–0.15)
Total Protein, Urine: 105 mg/dL

## 2016-12-26 LAB — GLUCOSE, CAPILLARY: Glucose-Capillary: 84 mg/dL (ref 65–99)

## 2016-12-26 MED ORDER — DIPHENHYDRAMINE HCL 50 MG/ML IJ SOLN: 12.5000 mg | INTRAMUSCULAR | Status: DC | PRN

## 2016-12-26 MED ORDER — PHENYLEPHRINE 40 MCG/ML (10ML) SYRINGE FOR IV PUSH (FOR BLOOD PRESSURE SUPPORT)
80.0000 ug | PREFILLED_SYRINGE | INTRAVENOUS | Status: DC | PRN
  Filled 2016-12-26: qty 10
  Filled 2016-12-26: qty 5

## 2016-12-26 MED ORDER — OXYCODONE-ACETAMINOPHEN 5-325 MG PO TABS: 2.0000 | ORAL_TABLET | ORAL | Status: DC | PRN

## 2016-12-26 MED ORDER — ACETAMINOPHEN 325 MG PO TABS: 650.0000 mg | ORAL_TABLET | ORAL | Status: DC | PRN

## 2016-12-26 MED ORDER — PHENYLEPHRINE 40 MCG/ML (10ML) SYRINGE FOR IV PUSH (FOR BLOOD PRESSURE SUPPORT)
80.0000 ug | PREFILLED_SYRINGE | INTRAVENOUS | Status: DC | PRN
Start: 2016-12-26 — End: 2016-12-26
  Filled 2016-12-26: qty 5

## 2016-12-26 MED ORDER — ONDANSETRON HCL 4 MG PO TABS: 4.0000 mg | ORAL_TABLET | ORAL | Status: DC | PRN

## 2016-12-26 MED ORDER — FENTANYL 2.5 MCG/ML BUPIVACAINE 1/10 % EPIDURAL INFUSION (WH - ANES)
14.0000 mL/h | INTRAMUSCULAR | Status: DC | PRN
  Administered 2016-12-26: 14 mL/h via EPIDURAL
  Filled 2016-12-26: qty 100

## 2016-12-26 MED ORDER — SOD CITRATE-CITRIC ACID 500-334 MG/5ML PO SOLN
30.0000 mL | ORAL | Status: DC | PRN
  Filled 2016-12-26: qty 15

## 2016-12-26 MED ORDER — LACTATED RINGERS IV SOLN: 500.0000 mL | Freq: Once | INTRAVENOUS | Status: DC

## 2016-12-26 MED ORDER — BENZOCAINE-MENTHOL 20-0.5 % EX AERO: 1.0000 "application " | INHALATION_SPRAY | CUTANEOUS | Status: DC | PRN

## 2016-12-26 MED ORDER — DIPHENHYDRAMINE HCL 25 MG PO CAPS: 25.0000 mg | ORAL_CAPSULE | Freq: Four times a day (QID) | ORAL | Status: DC | PRN

## 2016-12-26 MED ORDER — LACTATED RINGERS IV SOLN: 500.0000 mL | INTRAVENOUS | Status: DC | PRN

## 2016-12-26 MED ORDER — OXYCODONE HCL 5 MG PO TABS: 10.0000 mg | ORAL_TABLET | ORAL | Status: DC | PRN

## 2016-12-26 MED ORDER — PRENATAL MULTIVITAMIN CH
1.0000 | ORAL_TABLET | Freq: Every day | ORAL | Status: DC
  Administered 2016-12-27 – 2016-12-28 (×2): 1 via ORAL
  Filled 2016-12-26 (×2): qty 1

## 2016-12-26 MED ORDER — OXYTOCIN 40 UNITS IN LACTATED RINGERS INFUSION - SIMPLE MED
2.5000 [IU]/h | INTRAVENOUS | Status: DC
  Administered 2016-12-26: 2.5 [IU]/h via INTRAVENOUS
  Filled 2016-12-26: qty 1000

## 2016-12-26 MED ORDER — MAGNESIUM HYDROXIDE 400 MG/5ML PO SUSP: 30.0000 mL | ORAL | Status: DC | PRN

## 2016-12-26 MED ORDER — HYDRALAZINE HCL 20 MG/ML IJ SOLN
10.0000 mg | Freq: Once | INTRAMUSCULAR | Status: AC | PRN
  Administered 2016-12-26: 10 mg via INTRAVENOUS
  Filled 2016-12-26: qty 1

## 2016-12-26 MED ORDER — WITCH HAZEL-GLYCERIN EX PADS: 1.0000 "application " | MEDICATED_PAD | CUTANEOUS | Status: DC | PRN

## 2016-12-26 MED ORDER — OXYCODONE HCL 5 MG PO TABS: 5.0000 mg | ORAL_TABLET | ORAL | Status: DC | PRN

## 2016-12-26 MED ORDER — DIBUCAINE 1 % RE OINT: 1.0000 "application " | TOPICAL_OINTMENT | RECTAL | Status: DC | PRN

## 2016-12-26 MED ORDER — EPHEDRINE 5 MG/ML INJ
10.0000 mg | INTRAVENOUS | Status: DC | PRN
  Filled 2016-12-26: qty 2

## 2016-12-26 MED ORDER — BUTORPHANOL TARTRATE 1 MG/ML IJ SOLN: 1.0000 mg | INTRAMUSCULAR | Status: DC | PRN

## 2016-12-26 MED ORDER — LACTATED RINGERS IV SOLN
INTRAVENOUS | Status: DC
  Administered 2016-12-26: 07:00:00 via INTRAVENOUS

## 2016-12-26 MED ORDER — MEASLES, MUMPS & RUBELLA VAC ~~LOC~~ INJ
0.5000 mL | INJECTION | Freq: Once | SUBCUTANEOUS | Status: DC
  Filled 2016-12-26: qty 0.5

## 2016-12-26 MED ORDER — METHYLERGONOVINE MALEATE 0.2 MG/ML IJ SOLN: 0.2000 mg | INTRAMUSCULAR | Status: DC | PRN

## 2016-12-26 MED ORDER — LIDOCAINE HCL (PF) 1 % IJ SOLN
INTRAMUSCULAR | Status: DC | PRN
  Administered 2016-12-26 (×2): 4 mL via EPIDURAL

## 2016-12-26 MED ORDER — ONDANSETRON HCL 4 MG/2ML IJ SOLN: 4.0000 mg | INTRAMUSCULAR | Status: DC | PRN

## 2016-12-26 MED ORDER — METHYLERGONOVINE MALEATE 0.2 MG PO TABS: 0.2000 mg | ORAL_TABLET | ORAL | Status: DC | PRN

## 2016-12-26 MED ORDER — SIMETHICONE 80 MG PO CHEW: 80.0000 mg | CHEWABLE_TABLET | ORAL | Status: DC | PRN

## 2016-12-26 MED ORDER — LABETALOL HCL 5 MG/ML IV SOLN
20.0000 mg | INTRAVENOUS | Status: AC | PRN
  Administered 2016-12-26: 20 mg via INTRAVENOUS
  Administered 2016-12-26: 80 mg via INTRAVENOUS
  Administered 2016-12-26: 40 mg via INTRAVENOUS
  Filled 2016-12-26: qty 8
  Filled 2016-12-26: qty 4
  Filled 2016-12-26: qty 16

## 2016-12-26 MED ORDER — TETANUS-DIPHTH-ACELL PERTUSSIS 5-2.5-18.5 LF-MCG/0.5 IM SUSP: 0.5000 mL | Freq: Once | INTRAMUSCULAR | Status: DC

## 2016-12-26 MED ORDER — ZOLPIDEM TARTRATE 5 MG PO TABS: 5.0000 mg | ORAL_TABLET | Freq: Every evening | ORAL | Status: DC | PRN

## 2016-12-26 MED ORDER — OXYTOCIN BOLUS FROM INFUSION
500.0000 mL | Freq: Once | INTRAVENOUS | Status: AC
  Administered 2016-12-26: 500 mL via INTRAVENOUS

## 2016-12-26 MED ORDER — OXYCODONE-ACETAMINOPHEN 5-325 MG PO TABS: 1.0000 | ORAL_TABLET | ORAL | Status: DC | PRN

## 2016-12-26 MED ORDER — COCONUT OIL OIL: 1.0000 "application " | TOPICAL_OIL | Status: DC | PRN

## 2016-12-26 MED ORDER — SENNOSIDES-DOCUSATE SODIUM 8.6-50 MG PO TABS
2.0000 | ORAL_TABLET | ORAL | Status: DC
  Administered 2016-12-27 (×2): 2 via ORAL
  Filled 2016-12-26 (×2): qty 2

## 2016-12-26 MED ORDER — IBUPROFEN 600 MG PO TABS
600.0000 mg | ORAL_TABLET | Freq: Four times a day (QID) | ORAL | Status: DC
  Administered 2016-12-27 – 2016-12-28 (×7): 600 mg via ORAL
  Filled 2016-12-26 (×7): qty 1

## 2016-12-26 MED ORDER — LACTATED RINGERS IV SOLN: INTRAVENOUS | Status: DC

## 2016-12-26 MED ORDER — ONDANSETRON HCL 4 MG/2ML IJ SOLN: 4.0000 mg | Freq: Four times a day (QID) | INTRAMUSCULAR | Status: DC | PRN

## 2016-12-26 MED ORDER — LIDOCAINE HCL (PF) 1 % IJ SOLN
30.0000 mL | INTRAMUSCULAR | Status: DC | PRN
  Filled 2016-12-26: qty 30

## 2016-12-26 NOTE — MAU Provider Note (Signed)
History     CSN: [id number]  Arrival date and time: 12/26/16 [id number]   First Provider Initiated Contact with Patient 12/26/16 [phone]      Chief Complaint  Patient presents with  . Contractions  . Rupture of Membranes   HPI  Ms. Philippa ChesterLatoia Walker-Cathcart is a 36 y.o. [id number] at 8636w1d who presents to MAU today with complaint of possible SROM at 0530 today. She is also having contractions every few minutes. She denies bleeding. She reports normal fetal movement. She denies headache, visual changes, RUQ pain or increased LE edema. She has had some mild edema that improves with rest recently.   OB History    Gravida Para Term Preterm AB Living   5 2 2   2 2    SAB TAB Ectopic Multiple Live Births   1 1            Past Medical History:  Diagnosis Date  . Medical history non-contributory     Past Surgical History:  Procedure Laterality Date  . DILATION AND CURETTAGE OF UTERUS      Family History  Problem Relation Age of Onset  . Cancer Mother     Social History   Tobacco Use  . Smoking status: Former Smoker    Packs/day: 3.00    Types: Cigars  . Smokeless tobacco: Never Used  Substance Use Topics  . Alcohol use: No  . Drug use: No    Allergies:  Allergies  Allergen Reactions  . Peach [Prunus Persica] Itching and Swelling    Itching is of the throat, swelling is of the eyes.  Marland Kitchen. Penicillins   . Tomato Hives    Medications Prior to Admission  Medication Sig Dispense Refill Last Dose  . ketorolac (TORADOL) 10 MG tablet Take 1 tablet (10 mg total) by mouth every 6 (six) hours as needed for pain. 20 tablet 0   . Norethin Ace-Eth Estrad-FE (MINASTRIN 24 FE) 1-20 MG-MCG(24) CHEW Chew 1 tablet by mouth daily.   06/24/2012 at Unknown  . OVER THE COUNTER MEDICATION Take 1 capsule by mouth 2 (two) times daily. Takes one capsule before breakfast and one before lunch. Dietary supplement contains 50 mg of calcium, 200 mcg of chromium, 50 mg of potassium, and 1000 mg of Garcinia  Cambogia extract.   06/24/2012 at Unknown    Review of Systems  Constitutional: Negative for fever.  Eyes: Negative for visual disturbance.  Cardiovascular: Positive for leg swelling.  Gastrointestinal: Positive for abdominal pain.  Genitourinary: Positive for vaginal discharge. Negative for vaginal bleeding.  Neurological: Negative for headaches.   Physical Exam   Blood pressure (!) 163/103, pulse 80, temperature 98 F (36.7 C), temperature source Oral, resp. rate 18, height 5\' 3"  (1.6 m), weight 179 lb (81.2 kg), last menstrual period 04/03/2016, SpO2 100 %.  Physical Exam  Nursing note and vitals reviewed. Constitutional: She is oriented to person, place, and time. She appears well-developed and well-nourished. No distress.  HENT:  Head: Normocephalic and atraumatic.  Cardiovascular: Normal rate.  Respiratory: Effort normal.  GI: Soft. She exhibits no distension and no mass. There is no tenderness. There is no rebound and no guarding.  Musculoskeletal: She exhibits no edema.  Neurological: She is alert and oriented to person, place, and time. She has normal reflexes.  No Clonus  Skin: Skin is warm and dry. No erythema.  Psychiatric: She has a normal mood and affect.   No results found for this or any previous visit (from the past  24 hour(s)).  Fetal Monitoring: Baseline: 140 bpm Variability: moderate Accelerations:  10 x 10 Decelerations: none Contractions: q 1-2 minutes  Patient Vitals for the past 24 hrs:  BP Temp Temp src Pulse Resp SpO2 Height Weight  12/26/16 0716 (!) 163/103 - - 80 - - - -  12/26/16 0709 (!) 168/98 - - 75 - - - -  12/26/16 0708 (!) 144/101 - - 93 - - - -  12/26/16 [id number] (!) 168/98 - - - - - - -  12/26/16 0700 (!) 161/100 - - 80 - - - -  12/26/16 0656 - 98 F (36.7 C) Oral 80 18 100 % 5\' 3"  (1.6 m) 179 lb (81.2 kg)    MAU Course  Procedures None  MDM Serial BPs CBC, CMP, UA and Urine Protein/Creatinine ratio HTN protocol  started Discussed patient with Dr. Jackelyn KnifeMeisinger. Admit to L&D.  + Fern Assessment and Plan  A: SIUP at 1537w1d SROM  Severe HTN, labs pending   P: Admit to L&D   Vonzella NippleJulie Arne Schlender, PA-C 12/26/2016, 7:28 AM

## 2016-12-26 NOTE — MAU Note (Signed)
Pt presents to mau with contractions that started at 0500. Pt states her water broke at 0530 clear fluid.  currently still leaking fluid some light pink color.  She was seen in office yesterday where they stripped her membranes.  GBS. Negative 98

## 2016-12-26 NOTE — Anesthesia Preprocedure Evaluation (Signed)
Anesthesia Evaluation  Patient identified by MRN, date of birth, ID band Patient awake    Reviewed: Allergy & Precautions, Patient's Chart, lab work & pertinent test results, reviewed documented beta blocker date and time   Airway Mallampati: III  TM Distance: >3 FB Neck ROM: Full    Dental no notable dental hx. (+) Teeth Intact   Pulmonary former smoker,    Pulmonary exam normal breath sounds clear to auscultation       Cardiovascular hypertension, Pt. on medications Normal cardiovascular exam Rhythm:Regular Rate:Normal     Neuro/Psych negative neurological ROS  negative psych ROS   GI/Hepatic Neg liver ROS, GERD  ,  Endo/Other  Obesity  Renal/GU negative Renal ROS  negative genitourinary   Musculoskeletal negative musculoskeletal ROS (+)   Abdominal (+) + obese,   Peds  Hematology negative hematology ROS (+)   Anesthesia Other Findings   Reproductive/Obstetrics (+) Pregnancy AMA                             Anesthesia Physical Anesthesia Plan  ASA: II  Anesthesia Plan: Epidural   Post-op Pain Management:    Induction:   PONV Risk Score and Plan:   Airway Management Planned: Natural Airway  Additional Equipment:   Intra-op Plan:   Post-operative Plan:   Informed Consent: I have reviewed the patients History and Physical, chart, labs and discussed the procedure including the risks, benefits and alternatives for the proposed anesthesia with the patient or authorized representative who has indicated his/her understanding and acceptance.     Plan Discussed with:   Anesthesia Plan Comments:         Anesthesia Quick Evaluation

## 2016-12-26 NOTE — Progress Notes (Signed)
Tubal is now delayed for a second c-section.  Discussed with patient, she wants to wait until tomorrow morning so scheduled for 0800.

## 2016-12-26 NOTE — H&P (Signed)
Brooke Thornton is a 36 y.o. female, G5 P2022, EGA [redacted] weeks with EDC 11-23 presenting for leaking fluid and ctx.  On eval in MAU, ROM confirmed, reg ctx, BP elevated.  Pt admitted and is getting labetalol and hydralazine to control BP.  Prenatal care complicated by GDM controlled with diet, AMA with low risk panorama, recent slightly elevated BP with normal labs except mild proteinuria.  OB History    Gravida Para Term Preterm AB Living   5 2 2   2 2    SAB TAB Ectopic Multiple Live Births   1 1           Past Medical History:  Diagnosis Date  . Medical history non-contributory    Past Surgical History:  Procedure Laterality Date  . DILATION AND CURETTAGE OF UTERUS     Family History: family history includes Cancer in her mother. Social History:  reports that she has quit smoking. Her smoking use included cigars. She smoked 3.00 packs per day. she has never used smokeless tobacco. She reports that she does not drink alcohol or use drugs.     Maternal Diabetes: Yes:  Diabetes Type:  Diet controlled Genetic Screening: Normal Maternal Ultrasounds/Referrals: Normal Fetal Ultrasounds or other Referrals:  None Maternal Substance Abuse:  No Significant Maternal Medications:  None Significant Maternal Lab Results:  Lab values include: Group B Strep negative Other Comments:  None  Review of Systems  Respiratory: Negative.   Cardiovascular: Negative.    Maternal Medical History:  Reason for admission: Rupture of membranes and contractions.   Contractions: Frequency: regular.   Perceived severity is moderate.    Fetal activity: Perceived fetal activity is normal.    Prenatal Complications - Diabetes: gestational. Diabetes is managed by diet.      Dilation: 6.5 Effacement (%): 100 Station: -2 Exam by:: Laurell Josephsheryl Anderson RN Blood pressure (!) 166/102, pulse 65, temperature 98 F (36.7 C), temperature source Oral, resp. rate 18, height 5\' 3"  (1.6 m), weight 179 lb (81.2  kg), last menstrual period 04/03/2016, SpO2 100 %. Maternal Exam:  Uterine Assessment: Contraction strength is moderate.  Contraction frequency is regular.   Abdomen: Patient reports no abdominal tenderness. Estimated fetal weight is 8 lbs.   Fetal presentation: vertex  Introitus: Normal vulva. Normal vagina.  Ferning test: positive.  Amniotic fluid character: clear.  Pelvis: adequate for delivery.   Cervix: Cervix evaluated by digital exam.     Fetal Exam Fetal Monitor Review: Mode: ultrasound.   Baseline rate: 140.  Variability: moderate (6-25 bpm).   Pattern: variable decelerations.    Fetal State Assessment: Category II - tracings are indeterminate.     Physical Exam  Vitals reviewed. Constitutional: She appears well-developed and well-nourished.  Cardiovascular: Normal rate and regular rhythm.  Respiratory: Effort normal. No respiratory distress.  GI: Soft.    Prenatal labs: ABO, Rh:  A pos Antibody:  neg   Rubella:  Immune RPR:   NR HBsAg:    HIV:   NR GBS: Negative (10/24 0000)   Assessment/Plan: IUP at 38 weeks with ROM in active labor, preeclampsia with some severe range BP, A1GDM.  Labs are normal, glucose normal.  Getting epidural now, will monitor BP and decide if she needs magnesium for seizure prophylaxis, monitor progress and anticipate SVD.   Leighton Roachodd D Vernor Monnig 12/26/2016, 8:34 AM

## 2016-12-26 NOTE — Anesthesia Postprocedure Evaluation (Signed)
Anesthesia Post Note  Patient: Brooke Thornton, Brooke Thornton  Procedure(s) Performed: AN AD HOC LABOR EPIDURAL     Patient location during evaluation: Mother Baby Anesthesia Type: Epidural Level of consciousness: awake and alert Pain management: pain level controlled Vital Signs Assessment: post-procedure vital signs reviewed and stable Respiratory status: spontaneous breathing, nonlabored ventilation and respiratory function stable Cardiovascular status: stable Postop Assessment: no headache, no backache, epidural receding and patient able to bend at knees Anesthetic complications: no    Last Vitals:  Vitals:   12/26/16 1340 12/26/16 1440  BP: 134/73 129/76  Pulse: 67 63  Resp: 18 18  Temp: 37.2 C 37.3 C  SpO2:      Last Pain:  Vitals:   12/26/16 1440  TempSrc: Oral  PainSc: 0-No pain   Pain Goal: Patients Stated Pain Goal: 0 (12/26/16 0659)               Brooke Thornton,Brooke Thornton

## 2016-12-26 NOTE — Anesthesia Preprocedure Evaluation (Signed)
Anesthesia Evaluation  Patient identified by MRN, date of birth, ID band Patient awake    Reviewed: Allergy & Precautions, NPO status , Patient's Chart, lab work & pertinent test results, reviewed documented beta blocker date and time   Airway Mallampati: III  TM Distance: >3 FB Neck ROM: Full    Dental no notable dental hx. (+) Teeth Intact   Pulmonary former smoker,    Pulmonary exam normal breath sounds clear to auscultation       Cardiovascular hypertension, Pt. on medications Normal cardiovascular exam Rhythm:Regular Rate:Normal     Neuro/Psych negative neurological ROS  negative psych ROS   GI/Hepatic Neg liver ROS, GERD  ,  Endo/Other  Obesity  Renal/GU negative Renal ROS  negative genitourinary   Musculoskeletal negative musculoskeletal ROS (+)   Abdominal (+) + obese,   Peds  Hematology negative hematology ROS (+)   Anesthesia Other Findings   Reproductive/Obstetrics AMA Desires sterilization                             Anesthesia Physical  Anesthesia Plan  ASA: II  Anesthesia Plan: Epidural   Post-op Pain Management:    Induction:   PONV Risk Score and Plan: 4 or greater and Scopolamine patch - Pre-op, Dexamethasone, Ondansetron, Metaclopromide and Treatment may vary due to age or medical condition  Airway Management Planned: Natural Airway  Additional Equipment:   Intra-op Plan:   Post-operative Plan:   Informed Consent: I have reviewed the patients History and Physical, chart, labs and discussed the procedure including the risks, benefits and alternatives for the proposed anesthesia with the patient or authorized representative who has indicated his/her understanding and acceptance.   Dental advisory given  Plan Discussed with: CRNA, Anesthesiologist and Surgeon  Anesthesia Plan Comments:         Anesthesia Quick Evaluation

## 2016-12-26 NOTE — Lactation Note (Signed)
This note was copied from a baby's chart. Lactation Consultation Note  Patient Name: Brooke Thornton FAOZH'YToday's Date: 12/26/2016 Reason for consult: Initial assessment;Term   Initial consult at 129 hrs old; Mom is P3 with experience breastfeeding 2 older children for 1-2 months.  Mom GDM on Metformin. GA 38.1; BW 8 lbs, 3.6 oz.   Infant has breastfed x4 (15-20 min); voids-0; stools-1. Parents were trying to latch [facility] breast cradle [facility].   LC assisted with latching; taught cross-cradle hold on left side, sandwiching of breast tissue, and taught FOB how to assist using teacup hold.  Taught FOB how to do chin tug to get a wider latch.   Taught hand expression with return demonstration and observation of colostrum easily flowing.   Infant fed for 10 minutes and then came off crying.  LS-8. Parents independently re-latched infant [facility] with wide mouth and flanged lips. Educated on size of infant's stomach, cluster feeding, and feeding with feeding cues.  Encouraged exclusive breastfeeding. Lactation brochure given and informed of hospital support group and OP services. Encouraged to call RN for assistance as needed with latching.   Maternal Data Has patient been taught Hand Expression?: Yes(with return demonstration and observation of colostrum easily flowing) Does the patient have breastfeeding experience prior to this delivery?: Yes  Feeding Feeding Type: Breast Fed Length of feed: 20 min  LATCH Score Latch: Grasps breast easily, tongue down, lips flanged, rhythmical sucking.  Audible Swallowing: A few with stimulation  Type of Nipple: Everted at rest and after stimulation  Comfort (Breast/Nipple): Soft / non-tender  Hold (Positioning): Assistance needed to correctly position infant at breast and maintain latch.  LATCH Score: 8  Interventions Interventions: Breast feeding basics reviewed;Assisted with latch;Skin to  skin;Hand express;Expressed milk;Position options;Support pillows  Lactation Tools Discussed/Used WIC Program: Yes   Consult Status Consult Status: Follow-up    Lendon KaVann, Kareema Keitt Walker 12/26/2016, 6:24 PM

## 2016-12-26 NOTE — Anesthesia Procedure Notes (Signed)
Epidural Patient location during procedure: OB Start time: 12/26/2016 8:40 AM  Staffing Anesthesiologist: Mal AmabileFoster, Cassandra Mcmanaman, MD Performed: anesthesiologist   Preanesthetic Checklist Completed: patient identified, site marked, surgical consent, pre-op evaluation, timeout performed, IV checked, risks and benefits discussed and monitors and equipment checked  Epidural Patient position: sitting Prep: site prepped and draped and DuraPrep Patient monitoring: continuous pulse ox and blood pressure Approach: midline Location: L3-L4 Injection technique: LOR air  Needle:  Needle type: Tuohy  Needle gauge: 17 G Needle length: 9 cm and 9 Needle insertion depth: 6 cm Catheter type: closed end flexible Catheter size: 19 Gauge Catheter at skin depth: 11 cm Test dose: negative and Other  Assessment Events: blood not aspirated, injection not painful, no injection resistance, negative IV test and no paresthesia  Additional Notes Patient identified. Risks and benefits discussed including failed block, incomplete  Pain control, post dural puncture headache, nerve damage, paralysis, blood pressure Changes, nausea, vomiting, reactions to medications-both toxic and allergic and post Partum back pain. All questions were answered. Patient expressed understanding and wished to proceed. Sterile technique was used throughout procedure. Epidural site was Dressed with sterile barrier dressing. No paresthesias, signs of intravascular injection Or signs of intrathecal spread were encountered.  Patient was more comfortable after the epidural was dosed. Please see RN's note for documentation of vital signs and FHR which are stable.

## 2016-12-27 ENCOUNTER — Other Ambulatory Visit: Payer: Self-pay

## 2016-12-27 ENCOUNTER — Encounter (HOSPITAL_COMMUNITY)
Admission: AD | Disposition: A | Discharge status: Home / Self Care | Payer: Self-pay | Source: Ambulatory Visit | Attending: Obstetrics and Gynecology

## 2016-12-27 ENCOUNTER — Inpatient Hospital Stay (HOSPITAL_COMMUNITY): Payer: Medicaid Other | Admitting: Anesthesiology

## 2016-12-27 ENCOUNTER — Encounter (HOSPITAL_COMMUNITY): Payer: Self-pay | Admitting: Anesthesiology

## 2016-12-27 HISTORY — PX: TUBAL LIGATION: SHX77

## 2016-12-27 LAB — RPR: RPR Ser Ql: NONREACTIVE

## 2016-12-27 SURGERY — LIGATION, FALLOPIAN TUBE, POSTPARTUM
Anesthesia: Epidural | Laterality: Bilateral

## 2016-12-27 MED ORDER — LIDOCAINE-EPINEPHRINE (PF) 2 %-1:200000 IJ SOLN
INTRAMUSCULAR | Status: AC
  Filled 2016-12-27: qty 20

## 2016-12-27 MED ORDER — HYDROMORPHONE HCL 1 MG/ML IJ SOLN: 0.2500 mg | INTRAMUSCULAR | Status: DC | PRN

## 2016-12-27 MED ORDER — MEPERIDINE HCL 25 MG/ML IJ SOLN: 6.2500 mg | INTRAMUSCULAR | Status: DC | PRN

## 2016-12-27 MED ORDER — FAMOTIDINE 20 MG PO TABS
40.0000 mg | ORAL_TABLET | Freq: Once | ORAL | Status: AC
  Administered 2016-12-27: 40 mg via ORAL
  Filled 2016-12-27: qty 2

## 2016-12-27 MED ORDER — BUPIVACAINE HCL (PF) 0.25 % IJ SOLN
INTRAMUSCULAR | Status: AC
  Filled 2016-12-27: qty 30

## 2016-12-27 MED ORDER — FENTANYL CITRATE (PF) 100 MCG/2ML IJ SOLN
INTRAMUSCULAR | Status: DC | PRN
  Administered 2016-12-27 (×2): 25 ug via INTRAVENOUS
  Administered 2016-12-27: 50 ug via EPIDURAL

## 2016-12-27 MED ORDER — ONDANSETRON HCL 4 MG/2ML IJ SOLN
INTRAMUSCULAR | Status: DC | PRN
  Administered 2016-12-27: 4 mg via INTRAVENOUS

## 2016-12-27 MED ORDER — FENTANYL CITRATE (PF) 100 MCG/2ML IJ SOLN
INTRAMUSCULAR | Status: AC
  Filled 2016-12-27: qty 2

## 2016-12-27 MED ORDER — SODIUM BICARBONATE 8.4 % IV SOLN
INTRAVENOUS | Status: AC
  Filled 2016-12-27: qty 50

## 2016-12-27 MED ORDER — ONDANSETRON HCL 4 MG/2ML IJ SOLN
INTRAMUSCULAR | Status: AC
  Filled 2016-12-27: qty 2

## 2016-12-27 MED ORDER — MIDAZOLAM HCL 5 MG/5ML IJ SOLN
INTRAMUSCULAR | Status: DC | PRN
  Administered 2016-12-27 (×2): 0.5 mg via INTRAVENOUS
  Administered 2016-12-27: 1 mg via INTRAVENOUS

## 2016-12-27 MED ORDER — METOCLOPRAMIDE HCL 5 MG/ML IJ SOLN: 10.0000 mg | Freq: Once | INTRAMUSCULAR | Status: DC | PRN

## 2016-12-27 MED ORDER — BUPIVACAINE HCL (PF) 0.25 % IJ SOLN
INTRAMUSCULAR | Status: DC | PRN
  Administered 2016-12-27: 10 mL

## 2016-12-27 MED ORDER — LACTATED RINGERS IV SOLN
INTRAVENOUS | Status: DC | PRN
  Administered 2016-12-27: 08:00:00 via INTRAVENOUS

## 2016-12-27 MED ORDER — MIDAZOLAM HCL 2 MG/2ML IJ SOLN
INTRAMUSCULAR | Status: AC
  Filled 2016-12-27: qty 2

## 2016-12-27 MED ORDER — SODIUM BICARBONATE 8.4 % IV SOLN
INTRAVENOUS | Status: DC | PRN
  Administered 2016-12-27: 3 mL via EPIDURAL
  Administered 2016-12-27: 7 mL via EPIDURAL
  Administered 2016-12-27: 3 mL via EPIDURAL
  Administered 2016-12-27: 5 mL via EPIDURAL

## 2016-12-27 MED ORDER — METOCLOPRAMIDE HCL 10 MG PO TABS
10.0000 mg | ORAL_TABLET | Freq: Once | ORAL | Status: AC
  Administered 2016-12-27: 10 mg via ORAL
  Filled 2016-12-27: qty 1

## 2016-12-27 MED ORDER — LACTATED RINGERS IV SOLN
INTRAVENOUS | Status: DC
  Administered 2016-12-27: 07:00:00 via INTRAVENOUS

## 2016-12-27 SURGICAL SUPPLY — 24 items
BLADE SURG 11 STRL SS (BLADE) ×3 IMPLANT
CLOTH BEACON ORANGE TIMEOUT ST (SAFETY) ×3 IMPLANT
CONTAINER PREFILL 10% NBF 15ML (MISCELLANEOUS) ×6 IMPLANT
DRSG OPSITE 4X5.5 SM (GAUZE/BANDAGES/DRESSINGS) ×2 IMPLANT
DRSG OPSITE POSTOP 3X4 (GAUZE/BANDAGES/DRESSINGS) ×3 IMPLANT
DURAPREP 26ML APPLICATOR (WOUND CARE) ×3 IMPLANT
GLOVE BIO SURGEON STRL SZ8 (GLOVE) ×3 IMPLANT
GLOVE BIOGEL PI IND STRL 7.0 (GLOVE) ×1 IMPLANT
GLOVE BIOGEL PI INDICATOR 7.0 (GLOVE) ×2
GLOVE ORTHO TXT STRL SZ7.5 (GLOVE) ×3 IMPLANT
GOWN STRL REUS W/TWL LRG LVL3 (GOWN DISPOSABLE) ×6 IMPLANT
NEEDLE HYPO 22GX1.5 SAFETY (NEEDLE) ×3 IMPLANT
NS IRRIG 1000ML POUR BTL (IV SOLUTION) ×3 IMPLANT
PACK ABDOMINAL MINOR (CUSTOM PROCEDURE TRAY) ×3 IMPLANT
PROTECTOR NERVE ULNAR (MISCELLANEOUS) ×3 IMPLANT
SPONGE LAP 4X18 X RAY DECT (DISPOSABLE) IMPLANT
SUT PLAIN 0 NONE (SUTURE) ×3 IMPLANT
SUT VIC AB 0 CT1 27 (SUTURE) ×3
SUT VIC AB 0 CT1 27XBRD ANBCTR (SUTURE) ×1 IMPLANT
SUT VICRYL RAPIDE 4/0 PS 2 (SUTURE) ×3 IMPLANT
SYR CONTROL 10ML LL (SYRINGE) ×3 IMPLANT
TOWEL OR 17X24 6PK STRL BLUE (TOWEL DISPOSABLE) ×6 IMPLANT
TRAY FOLEY CATH SILVER 14FR (SET/KITS/TRAYS/PACK) ×3 IMPLANT
WATER STERILE IRR 1000ML POUR (IV SOLUTION) ×3 IMPLANT

## 2016-12-27 NOTE — Progress Notes (Signed)
PPD #1 Doing well, ready for BTL Afeb, VSS, BP normal since delivery-140/70-80 last 2 readings Fundus firm Will do ahead with BTL this morning, continue routine care, monitor BP

## 2016-12-27 NOTE — Op Note (Signed)
Preoperative diagnosis: Postpartum, desires surgical sterility Postoperative diagnosis: Same Procedure: Postpartum bilateral salpingectomy Surgeon: Lavina Hammanodd Ahkeem Goede M.D. Anesthesia: Epidural Findings: She had normal postpartum anatomy Estimated blood loss: Minimal Specimens: Portions of bilateral fallopian tubes Complications: None  Procedure in detail:  The patient was taken to the operating room placed in the dorsosupine position. Her previously placed epidural was dosed appropriately. Abdomen was then prepped and draped in the usual sterile fashion and a foley catheter inserted. The level of her anesthesia was found to be adequate. Infraumbilical skin was infiltrated with quarter percent Marcaine and a 3 cm horizontal incision was made. The fascia was identified and elevated. Fascia was entered sharply and this also entered the peritoneal cavity. [employer] retractors were then used to expose both fallopian tubes. Each fallopian tube was identified and traced to its fimbriated end. The middle portion of each fallopian tube was elevated with a Babcock clamp. A knuckle of tube on each side was then ligated with #0 plain gut suture and the knuckle of tube was removed sharply. On both sides both ostia were identified and the stumps were hemostatic. Fascia was then closed in running fashion with 0 Vicryl. Skin was closed with running subcuticular suture of 4 4-0 Vicryl followed by a sterile dressing. Patient tolerated the procedure well and was taken to the recovery in stable condition. Counts were correct and she had PAS hose on throughout the procedure.

## 2016-12-27 NOTE — Lactation Note (Signed)
This note was copied from a baby's chart. Lactation Consultation Note  Patient Name: Brooke Thornton Brooke Thornton Date: 12/27/2016 Reason for consult: Follow-up assessment Infant is 55 hours old & seen by Lactation for follow-up assessment. Baby was asleep in basinet when Brooke Thornton entered. Per the flowsheet, mom had not latched baby since 8:30pm last night but mom stated she has been BF- updated chart. Mom's plan is to put baby to the breast first and then supplement after if baby still appears hungry and then to pump. Mom reports she pumped ~42m last time & is using the Initiate phase. Mom encouraged to feed baby 8-12 times/24 hours and with feeding cues. Mom reports she has only been offering one breast per feeding so encouraged mom to start offering both to see if that will help satisfy baby for longer between BF. Mom reports she has an Evenflow pump at home- discussed how not all pumps are made equal and that she may find the motor is not as strong. Showed mom how to convert pump kit to double manual pump. Mom [place] has [place] discussed how they usually loan DEBP to moms who are exclusively BF.  Mom reports she has not been hand expressing except right before latching baby. Encouraged mom to hand express for ~5 mins after pumping. Encouraged mom to continue with plan- BF at least 8-12x in 24 hrs followed by pumping if someone will be supplementing baby with expressed breast milk and/ or formula. Encouraged mom to pump for 15-20 mins followed by hand expression. Mom reports no questions at this time. Encouraged mom to ask for help as needed.    Maternal Data    Feeding Feeding Type: Breast Fed Length of feed: 15 min  LATCH Score                   Interventions Interventions: Breast feeding basics reviewed;Hand express;DEBP  Lactation Tools Discussed/Used WIC Program: Yes   Consult Status Consult Status: Follow-up Date: 12/28/16 Follow-up type:  In-patient    Brooke Alanis11/12/2016, 9:36 PM

## 2016-12-27 NOTE — Transfer of Care (Signed)
Immediate Anesthesia Transfer of Care Note  Patient: Brooke Thornton  Procedure(s) Performed: POST PARTUM TUBAL LIGATION (Bilateral )  Patient Location: PACU  Anesthesia Type:Epidural  Level of Consciousness: awake, alert  and oriented  Airway & Oxygen Therapy: Patient Spontanous Breathing  Post-op Assessment: Report given to RN and Post -op Vital signs reviewed and stable  Post vital signs: Reviewed and stable  Last Vitals:  Vitals:   12/27/16 0015 12/27/16 0621  BP: (!) 142/81 (!) 142/70  Pulse: 63 (!) 58  Resp: 18 18  Temp: 36.9 C 36.9 C  SpO2:      Last Pain:  Vitals:   12/27/16 0622  TempSrc:   PainSc: 2       Patients Stated Pain Goal: 0 (12/26/16 0659)  Complications: No apparent anesthesia complications

## 2016-12-27 NOTE — Anesthesia Postprocedure Evaluation (Signed)
Anesthesia Post Note  Patient: Chiropractor  Procedure(s) Performed: POST PARTUM TUBAL LIGATION (Bilateral )     Patient location during evaluation: PACU Anesthesia Type: Epidural and MAC Level of consciousness: awake and alert Pain management: pain level controlled Vital Signs Assessment: post-procedure vital signs reviewed and stable Respiratory status: spontaneous breathing and respiratory function stable Cardiovascular status: blood pressure returned to baseline and stable Postop Assessment: spinal receding Anesthetic complications: no    Last Vitals:  Vitals:   12/27/16 0930 12/27/16 0945  BP: 121/75 133/79  Pulse: (!) 55 66  Resp: 14 (!) 22  Temp: 36.7 C   SpO2: 99% 99%    Last Pain:  Vitals:   12/27/16 0945  TempSrc:   PainSc: 0-No pain   Pain Goal: Patients Stated Pain Goal: 0 (12/26/16 0659)               Duane Boston DANIEL

## 2016-12-28 LAB — GLUCOSE, CAPILLARY: GLUCOSE-CAPILLARY: 94 mg/dL (ref 65–99)

## 2016-12-28 MED ORDER — OXYCODONE-ACETAMINOPHEN 5-325 MG PO TABS
1.0000 | ORAL_TABLET | ORAL | 0 refills | Status: AC | PRN
Start: 2016-12-28 — End: ?

## 2016-12-28 MED ORDER — IBUPROFEN 600 MG PO TABS: 600.0000 mg | ORAL_TABLET | Freq: Four times a day (QID) | ORAL | 1 refills | Status: AC | PRN

## 2016-12-28 NOTE — Anesthesia Postprocedure Evaluation (Signed)
Anesthesia Post Note  Patient: Teacher, [name]  Procedure(s) Performed: POST PARTUM TUBAL LIGATION (Bilateral )     Patient location during evaluation: Mother Baby Anesthesia Type: Epidural Level of consciousness: awake Pain management: satisfactory to patient Vital Signs Assessment: post-procedure vital signs reviewed and stable Respiratory status: spontaneous breathing Cardiovascular status: stable Anesthetic complications: no    Last Vitals:  Vitals:   12/28/16 0005 12/28/16 0337  BP: 133/69 (!) 157/83  Pulse: 63 60  Resp: 18 18  Temp: 36.7 C 36.7 C  SpO2: 100% 100%    Last Pain:  Vitals:   12/27/16 1749  TempSrc: Oral  PainSc:    Pain Goal: Patients Stated Pain Goal: 0 (12/26/16 0659)               [name],[name]

## 2016-12-28 NOTE — Addendum Note (Signed)
Addendum  created 12/28/16 0847 by Algis GreenhouseBurger, Cinsere Mizrahi A, CRNA   Sign clinical note

## 2016-12-28 NOTE — Discharge Summary (Signed)
OB Discharge Summary     Patient Name: Brooke Thornton DOB: 06/12/1980 MRN: [id number]  Date of admission: 12/26/2016 Delivering MD: Jackelyn KnifeMEISINGER, TODD   Date of discharge: 12/28/2016  Admitting diagnosis: 38 wks water broke ctx every 4 min Intrauterine pregnancy: 1737w1d     Secondary diagnosis:  Active Problems:   Indication for care in labor or delivery   Preeclampsia, third trimester   Vacuum extractor delivery, delivered  Additional problems: none     Discharge diagnosis: Term Pregnancy Delivered, Preeclampsia (severe) and GDM A1                                                                                                Post partum procedures:postpartum tubal ligation  Augmentation: none  Complications: None  Hospital course:  Onset of Labor With Vaginal Delivery     36 y.o. yo [id number] at 6337w1d was admitted in Active Labor on 12/26/2016. Patient had an uncomplicated labor course as follows:  Membrane Rupture Time/Date: 5:30 AM ,12/26/2016   Intrapartum Procedures: Episiotomy: None [1]                                         Lacerations:  None [1]  Patient had a delivery of a Viable infant. 12/26/2016  Information for the patient's newborn:  Thornton, Girl Philippa ChesterLatoia [[id number]][[id number]]  Delivery Method: Vag-Spont    Pateint had an uncomplicated postpartum course.  She is ambulating, tolerating a regular diet, passing flatus, and urinating well. Patient is discharged home in stable condition on 12/28/16.   Physical exam  Vitals:   12/27/16 1749 12/28/16 0005 12/28/16 0337 12/28/16 0948  BP: (!) 160/69 133/69 (!) 157/83 (!) 145/76  Pulse: 66 63 60 63  Resp: 17 18 18 20   Temp: 98.6 F (37 C) 98.1 F (36.7 C) 98.1 F (36.7 C)   TempSrc: Oral     SpO2: 100% 100% 100%   Weight:      Height:       General: alert, cooperative and no distress Lochia: appropriate Uterine Fundus: firm Incision: Healing well with no significant drainage DVT Evaluation: No  significant calf/ankle edema. Labs: Lab Results  Component Value Date   WBC 8.8 12/26/2016   HGB 12.9 12/26/2016   HCT 37.8 12/26/2016   MCV 93.6 12/26/2016   PLT 214 12/26/2016   CMP Latest Ref Rng & Units 12/26/2016  Glucose 65 - 99 mg/dL 95  BUN 6 - 20 mg/dL 11  Creatinine 1.300.44 - 8.651.00 mg/dL 7.840.55  Sodium 696135 - 295145 mmol/L 134(L)  Potassium 3.5 - 5.1 mmol/L 4.0  Chloride 101 - 111 mmol/L 104  CO2 22 - 32 mmol/L 19(L)  Calcium 8.9 - 10.3 mg/dL 2.8(U8.8(L)  Total Protein 6.5 - 8.1 g/dL 6.6  Total Bilirubin 0.3 - 1.2 mg/dL 0.5  Alkaline Phos 38 - 126 U/L 245(H)  AST 15 - 41 U/L 33  ALT 14 - 54 U/L 19    Discharge instruction: per After Visit Summary and "Baby and Me Booklet".  After visit meds:  Allergies as of 12/28/2016      Reactions   Peach [prunus Persica] Itching, Swelling   Itching is of the throat, swelling is of the eyes.   Penicillins Other (See Comments)   Has patient had a PCN reaction causing immediate rash, facial/tongue/throat swelling, SOB or lightheadedness with hypotension: Unknown Has patient had a PCN reaction causing severe rash involving mucus membranes or skin necrosis: Unknown Has patient had a PCN reaction that required hospitalization: No Has patient had a PCN reaction occurring within the last 10 years: No If all of the above answers are "NO", then may proceed with Cephalosporin use.   Tomato Hives      Medication List    TAKE these medications   famotidine 10 MG chewable tablet Commonly known as:  PEPCID AC Chew 10 mg 2 (two) times daily as needed by mouth for heartburn.   ibuprofen 600 MG tablet Commonly known as:  ADVIL,MOTRIN Take 1 tablet (600 mg total) every 6 (six) hours as needed by mouth.   metFORMIN 500 MG tablet Commonly known as:  GLUCOPHAGE Take 500 mg at bedtime by mouth.   oxyCODONE-acetaminophen 5-325 MG tablet Commonly known as:  PERCOCET Take 1 tablet every 4 (four) hours as needed by mouth for severe pain.   prenatal  multivitamin Tabs tablet Take 1 tablet daily at 12 noon by mouth.       Diet: low salt diet  Activity: Advance as tolerated. Pelvic rest for 6 weeks.   Outpatient follow up:2 days for BP check, 3 week phone call and 6 week postpartum visit Follow up Appt:No future appointments. Follow up Visit:No Follow-up on file.  Postpartum contraception: Tubal Ligation  Newborn Data: Live born female  Birth Weight: 8 lb 3.6 oz (3731 g) APGAR: 8, 9  Newborn Delivery   Birth date/time:  12/26/2016 09:12:00 Delivery type:  Vaginal, Vacuum (Extractor)     Baby Feeding: Breast Disposition:home with mother   12/28/2016 Cathrine Musterecilia W Alfred Harrel, DO

## 2016-12-28 NOTE — Lactation Note (Signed)
This note was copied from a baby's chart. Lactation Consultation Note  Patient Name: Brooke Thornton Date: 12/28/2016 Reason for consult: Follow-up assessment   Maternal Data Has patient been taught Hand Expression?: Yes  Follow up visit at 49 hours of age.  Mom pumping with small amounts collected.  Baby showing feeding cues, FOB assisted mom with latching baby.  Mom denies pain or concerns at this time. FOB answering questions about pumping for mom.  LC instructed parents on use of DEBP kit piston pump as needed, FOB reports they were already shown and this is their 4th child.   LC reviewed engorgement care and o/p services.  Baby has follow up with peds in 2 days.      Feeding Feeding Type: Breast Fed Length of feed: (few minutes observed)  LATCH Score Latch: Grasps breast easily, tongue down, lips flanged, rhythmical sucking.  Audible Swallowing: A few with stimulation  Type of Nipple: Everted at rest and after stimulation  Comfort (Breast/Nipple): Soft / non-tender  Hold (Positioning): No assistance needed to correctly position infant at breast.  LATCH Score: 9  Interventions    Lactation Tools Discussed/Used Breast pump type: Double-Electric Breast Pump WIC Program: Yes   Consult Status Consult Status: Complete    Malena Edman 12/28/2016, 10:49 AM

## 2016-12-28 NOTE — Progress Notes (Signed)
Patient ID: Brooke Thornton, female   DOB: 01/10/1981, 36 y.o.   MRN: [id number] Pt doing well. Pain well controlled, lochia mild. Ambulating and voiding well. No HAs, blurry vision, fever or chills. Bonding well with baby; ready for discharge to home today. Breastfeeding.  VS- 145/76 this am ABD - mild distension but +BS, non tender, FF EXT - no homans  A/P: PPD# 2 s/p svd and POD#1 from BTL with labile BPs but stable now         Reviewed discharge instructions and preE precautions         BP check in office in two days         FOB present and aware of precautions

## 2016-12-28 NOTE — Discharge Instructions (Signed)
Nothing in vagina for 6 weeks.  No sex, tampons, and douching.  Other instructions as in [facility] Discharge Booklet. °

## 2022-06-20 ENCOUNTER — Other Ambulatory Visit (HOSPITAL_COMMUNITY): Payer: Self-pay
